# Patient Record
Sex: Female | Born: 1965 | Race: Black or African American | Hispanic: No | Marital: Single | Smoking: Never smoker
Health system: Southern US, Community
[De-identification: ages and names within clinical notes are randomized; demographics above are authoritative.]

## PROBLEM LIST (undated history)

## (undated) DIAGNOSIS — D649 Anemia, unspecified: Secondary | ICD-10-CM

## (undated) DIAGNOSIS — E079 Disorder of thyroid, unspecified: Secondary | ICD-10-CM

## (undated) HISTORY — DX: Disorder of thyroid, unspecified: E07.9

## (undated) HISTORY — DX: Anemia, unspecified: D64.9

## (undated) HISTORY — PX: BRAIN TUMOR EXCISION: SHX577

---

## 2004-09-19 ENCOUNTER — Ambulatory Visit: Payer: Self-pay

## 2005-12-17 ENCOUNTER — Ambulatory Visit: Payer: Self-pay | Admitting: Family Medicine

## 2006-05-03 DIAGNOSIS — I059 Rheumatic mitral valve disease, unspecified: Secondary | ICD-10-CM | POA: Insufficient documentation

## 2006-05-03 HISTORY — DX: Rheumatic mitral valve disease, unspecified: I05.9

## 2007-04-02 DIAGNOSIS — Z86018 Personal history of other benign neoplasm: Secondary | ICD-10-CM | POA: Insufficient documentation

## 2008-05-03 ENCOUNTER — Ambulatory Visit: Payer: Self-pay | Admitting: Family Medicine

## 2009-05-09 ENCOUNTER — Ambulatory Visit: Payer: Self-pay

## 2010-05-16 ENCOUNTER — Ambulatory Visit: Payer: Self-pay

## 2010-07-06 DIAGNOSIS — E559 Vitamin D deficiency, unspecified: Secondary | ICD-10-CM | POA: Insufficient documentation

## 2011-06-09 ENCOUNTER — Ambulatory Visit: Payer: Self-pay | Admitting: Internal Medicine

## 2011-06-10 ENCOUNTER — Ambulatory Visit: Payer: Self-pay | Admitting: Family Medicine

## 2011-06-25 ENCOUNTER — Ambulatory Visit: Payer: Self-pay | Admitting: Family Medicine

## 2011-07-05 ENCOUNTER — Ambulatory Visit: Payer: Self-pay | Admitting: Internal Medicine

## 2011-07-10 ENCOUNTER — Ambulatory Visit: Payer: Self-pay | Admitting: Internal Medicine

## 2011-08-16 ENCOUNTER — Ambulatory Visit: Payer: Self-pay | Admitting: Internal Medicine

## 2011-09-09 ENCOUNTER — Ambulatory Visit: Payer: Self-pay | Admitting: Internal Medicine

## 2012-07-15 ENCOUNTER — Ambulatory Visit: Payer: Self-pay | Admitting: Family Medicine

## 2013-08-10 ENCOUNTER — Ambulatory Visit: Payer: Self-pay | Admitting: Family Medicine

## 2013-09-08 ENCOUNTER — Ambulatory Visit: Payer: Self-pay | Admitting: Family Medicine

## 2015-07-17 DIAGNOSIS — N920 Excessive and frequent menstruation with regular cycle: Secondary | ICD-10-CM | POA: Insufficient documentation

## 2015-07-17 DIAGNOSIS — D509 Iron deficiency anemia, unspecified: Secondary | ICD-10-CM | POA: Insufficient documentation

## 2015-07-19 ENCOUNTER — Encounter: Payer: Self-pay | Admitting: Physician Assistant

## 2015-08-02 ENCOUNTER — Encounter: Payer: Self-pay | Admitting: Physician Assistant

## 2015-08-02 ENCOUNTER — Ambulatory Visit (INDEPENDENT_AMBULATORY_CARE_PROVIDER_SITE_OTHER): Payer: 59 | Admitting: Physician Assistant

## 2015-08-02 VITALS — BP 104/78 | HR 84 | Temp 98.3°F | Resp 14 | Ht 63.25 in | Wt 142.4 lb

## 2015-08-02 DIAGNOSIS — E559 Vitamin D deficiency, unspecified: Secondary | ICD-10-CM

## 2015-08-02 DIAGNOSIS — Z1239 Encounter for other screening for malignant neoplasm of breast: Secondary | ICD-10-CM | POA: Diagnosis not present

## 2015-08-02 DIAGNOSIS — E039 Hypothyroidism, unspecified: Secondary | ICD-10-CM

## 2015-08-02 DIAGNOSIS — Z0000 Encounter for general adult medical examination without abnormal findings: Secondary | ICD-10-CM | POA: Diagnosis not present

## 2015-08-02 DIAGNOSIS — D509 Iron deficiency anemia, unspecified: Secondary | ICD-10-CM

## 2015-08-02 NOTE — Patient Instructions (Signed)
Health Maintenance Adopting a healthy lifestyle and getting preventive care can go a long way to promote health and wellness. Talk with your health care provider about what schedule of regular examinations is right for you. This is a good chance for you to check in with your provider about disease prevention and staying healthy. In between checkups, there are plenty of things you can do on your own. Experts have done a lot of research about which lifestyle changes and preventive measures are most likely to keep you healthy. Ask your health care provider for more information. WEIGHT AND DIET  Eat a healthy diet 1. Be sure to include plenty of vegetables, fruits, low-fat dairy products, and lean protein. 2. Do not eat a lot of foods high in solid fats, added sugars, or salt. 3. Get regular exercise. This is one of the most important things you can do for your health. 1. Most adults should exercise for at least 150 minutes each week. The exercise should increase your heart rate and make you sweat (moderate-intensity exercise). 2. Most adults should also do strengthening exercises at least twice a week. This is in addition to the moderate-intensity exercise.  Maintain a healthy weight 1. Body mass index (BMI) is a measurement that can be used to identify possible weight problems. It estimates body fat based on height and weight. Your health care provider can help determine your BMI and help you achieve or maintain a healthy weight. 2. For females 6 years of age and older:  1. A BMI below 18.5 is considered underweight. 2. A BMI of 18.5 to 24.9 is normal. 3. A BMI of 25 to 29.9 is considered overweight. 4. A BMI of 30 and above is considered obese.  Watch levels of cholesterol and blood lipids 1. You should start having your blood tested for lipids and cholesterol at 47 years of age, then have this test every 5 years. 2. You may need to have your cholesterol levels checked more often if: 1. Your  lipid or cholesterol levels are high. 2. You are older than 47 years of age. 3. You are at high risk for heart disease.  CANCER SCREENING   Lung Cancer 1. Lung cancer screening is recommended for adults 7-87 years old who are at high risk for lung cancer because of a history of smoking. 2. A yearly low-dose CT scan of the lungs is recommended for people who: 1. Currently smoke. 2. Have quit within the past 15 years. 3. Have at least a 30-pack-year history of smoking. A pack year is smoking an average of one pack of cigarettes a day for 1 year. 3. Yearly screening should continue until it has been 15 years since you quit. 4. Yearly screening should stop if you develop a health problem that would prevent you from having lung cancer treatment.  Breast Cancer  Practice breast self-awareness. This means understanding how your breasts normally appear and feel.  It also means doing regular breast self-exams. Let your health care provider know about any changes, no matter how small.  If you are in your 20s or 30s, you should have a clinical breast exam (CBE) by a health care provider every 1-3 years as part of a regular health exam.  If you are 30 or older, have a CBE every year. Also consider having a breast X-Madlock (mammogram) every year.  If you have a family history of breast cancer, talk to your health care provider about genetic screening.  If you are  at high risk for breast cancer, talk to your health care provider about having an MRI and a mammogram every year.  Breast cancer gene (BRCA) assessment is recommended for women who have family members with BRCA-related cancers. BRCA-related cancers include:  Breast.  Ovarian.  Tubal.  Peritoneal cancers.  Results of the assessment will determine the need for genetic counseling and BRCA1 and BRCA2 testing. Cervical Cancer Routine pelvic examinations to screen for cervical cancer are no longer recommended for nonpregnant women who  are considered low risk for cancer of the pelvic organs (ovaries, uterus, and vagina) and who do not have symptoms. A pelvic examination may be necessary if you have symptoms including those associated with pelvic infections. Ask your health care provider if a screening pelvic exam is right for you.   The Pap test is the screening test for cervical cancer for women who are considered at risk.  If you had a hysterectomy for a problem that was not cancer or a condition that could lead to cancer, then you no longer need Pap tests.  If you are older than 65 years, and you have had normal Pap tests for the past 10 years, you no longer need to have Pap tests.  If you have had past treatment for cervical cancer or a condition that could lead to cancer, you need Pap tests and screening for cancer for at least 20 years after your treatment.  If you no longer get a Pap test, assess your risk factors if they change (such as having a new sexual partner). This can affect whether you should start being screened again.  Some women have medical problems that increase their chance of getting cervical cancer. If this is the case for you, your health care provider may recommend more frequent screening and Pap tests.  The human papillomavirus (HPV) test is another test that may be used for cervical cancer screening. The HPV test looks for the virus that can cause cell changes in the cervix. The cells collected during the Pap test can be tested for HPV.  The HPV test can be used to screen women 2 years of age and older. Getting tested for HPV can extend the interval between normal Pap tests from three to five years.  An HPV test also should be used to screen women of any age who have unclear Pap test results.  After 47 years of age, women should have HPV testing as often as Pap tests.  Colorectal Cancer  This type of cancer can be detected and often prevented.  Routine colorectal cancer screening usually  begins at 47 years of age and continues through 47 years of age.  Your health care provider may recommend screening at an earlier age if you have risk factors for colon cancer.  Your health care provider may also recommend using home test kits to check for hidden blood in the stool.  A small camera at the end of a tube can be used to examine your colon directly (sigmoidoscopy or colonoscopy). This is done to check for the earliest forms of colorectal cancer.  Routine screening usually begins at age 57.  Direct examination of the colon should be repeated every 5-10 years through 47 years of age. However, you may need to be screened more often if early forms of precancerous polyps or small growths are found. Skin Cancer  Check your skin from head to toe regularly.  Tell your health care provider about any new moles or changes in  moles, especially if there is a change in a mole's shape or color.  Also tell your health care provider if you have a mole that is larger than the size of a pencil eraser.  Always use sunscreen. Apply sunscreen liberally and repeatedly throughout the day.  Protect yourself by wearing long sleeves, pants, a wide-brimmed hat, and sunglasses whenever you are outside. HEART DISEASE, DIABETES, AND HIGH BLOOD PRESSURE   Have your blood pressure checked at least every 1-2 years. High blood pressure causes heart disease and increases the risk of stroke.  If you are between 32 years and 30 years old, ask your health care provider if you should take aspirin to prevent strokes.  Have regular diabetes screenings. This involves taking a blood sample to check your fasting blood sugar level.  If you are at a normal weight and have a low risk for diabetes, have this test once every three years after 47 years of age.  If you are overweight and have a high risk for diabetes, consider being tested at a younger age or more often. PREVENTING INFECTION  Hepatitis B  If you have a  higher risk for hepatitis B, you should be screened for this virus. You are considered at high risk for hepatitis B if:  You were born in a country where hepatitis B is common. Ask your health care provider which countries are considered high risk.  Your parents were born in a high-risk country, and you have not been immunized against hepatitis B (hepatitis B vaccine).  You have HIV or AIDS.  You use needles to inject street drugs.  You live with someone who has hepatitis B.  You have had sex with someone who has hepatitis B.  You get hemodialysis treatment.  You take certain medicines for conditions, including cancer, organ transplantation, and autoimmune conditions. Hepatitis C  Blood testing is recommended for:  Everyone born from [birth year] through 1963.  Anyone with known risk factors for hepatitis C. Sexually transmitted infections (STIs)  You should be screened for sexually transmitted infections (STIs) including gonorrhea and chlamydia if:  You are sexually active and are younger than 47 years of age.  You are older than 47 years of age and your health care provider tells you that you are at risk for this type of infection.  Your sexual activity has changed since you were last screened and you are at an increased risk for chlamydia or gonorrhea. Ask your health care provider if you are at risk.  If you do not have HIV, but are at risk, it may be recommended that you take a prescription medicine daily to prevent HIV infection. This is called pre-exposure prophylaxis (PrEP). You are considered at risk if:  You are sexually active and do not regularly use condoms or know the HIV status of your partner(s).  You take drugs by injection.  You are sexually active with a partner who has HIV. Talk with your health care provider about whether you are at high risk of being infected with HIV. If you choose to begin PrEP, you should first be tested for HIV. You should then be tested  every 3 months for as long as you are taking PrEP.  PREGNANCY   If you are premenopausal and you may become pregnant, ask your health care provider about preconception counseling.  If you may become pregnant, take 400 to 800 micrograms (mcg) of folic acid every day.  If you want to prevent pregnancy, talk to your  health care provider about birth control (contraception). OSTEOPOROSIS AND MENOPAUSE   Osteoporosis is a disease in which the bones lose minerals and strength with aging. This can result in serious bone fractures. Your risk for osteoporosis can be identified using a bone density scan.  If you are 34 years of age or older, or if you are at risk for osteoporosis and fractures, ask your health care provider if you should be screened.  Ask your health care provider whether you should take a calcium or vitamin D supplement to lower your risk for osteoporosis.  Menopause may have certain physical symptoms and risks.  Hormone replacement therapy may reduce some of these symptoms and risks. Talk to your health care provider about whether hormone replacement therapy is right for you.  HOME CARE INSTRUCTIONS   Schedule regular health, dental, and eye exams.  Stay current with your immunizations.   Do not use any tobacco products including cigarettes, chewing tobacco, or electronic cigarettes.  If you are pregnant, do not drink alcohol.  If you are breastfeeding, limit how much and how often you drink alcohol.  Limit alcohol intake to no more than 1 drink per day for nonpregnant women. One drink equals 12 ounces of beer, 5 ounces of wine, or 1 ounces of hard liquor.  Do not use street drugs.  Do not share needles.  Ask your health care provider for help if you need support or information about quitting drugs.  Tell your health care provider if you often feel depressed.  Tell your health care provider if you have ever been abused or do not feel safe at home. Document  Released: 06/10/2011 Document Revised: 04/11/2014 Document Reviewed: 10/27/2013 [facility] Patient Information 2015 [place], [place]. This information is not intended to replace advice given to you by your health care provider. Make sure you discuss any questions you have with your health care provider.     Why follow it? Research shows. . Those who follow the Mediterranean diet have a reduced risk of heart disease  . The diet is associated with a reduced incidence of Parkinson's and Alzheimer's diseases . People following the diet may have longer life expectancies and lower rates of chronic diseases  . The Dietary Guidelines for Americans recommends the Mediterranean diet as an eating plan to promote health and prevent disease  What Is the Mediterranean Diet?  . Healthy eating plan based on typical foods and recipes of Mediterranean-style cooking . The diet is primarily a plant based diet; these foods should make up a majority of meals   Starches - Plant based foods should make up a majority of meals - They are an important sources of vitamins, minerals, energy, antioxidants, and fiber - Choose whole grains, foods high in fiber and minimally processed items  - Typical grain sources include wheat, oats, barley, corn, brown rice, bulgar, farro, millet, polenta, couscous  - Various types of beans include chickpeas, lentils, fava beans, black beans, white beans   Fruits  Veggies - Large quantities of antioxidant rich fruits & veggies; 6 or more servings  - Vegetables can be eaten raw or lightly drizzled with oil and cooked  - Vegetables common to the traditional Mediterranean Diet include: artichokes, arugula, beets, broccoli, brussel sprouts, cabbage, carrots, celery, collard greens, cucumbers, eggplant, kale, leeks, lemons, lettuce, mushrooms, okra, onions, peas, peppers, potatoes, pumpkin, radishes, rutabaga, shallots, spinach, sweet potatoes, turnips, zucchini - Fruits common to the Mediterranean  Diet include: apples, apricots, avocados, cherries, clementines, dates, figs, grapefruits,  grapes, melons, nectarines, oranges, peaches, pears, pomegranates, strawberries, tangerines  Fats - Replace butter and margarine with healthy oils, such as olive oil, canola oil, and tahini  - Limit nuts to no more than a handful a day  - Nuts include walnuts, almonds, pecans, pistachios, pine nuts  - Limit or avoid candied, honey roasted or heavily salted nuts - Olives are central to the Mediterranean diet - can be eaten whole or used in a variety of dishes   Meats Protein - Limiting red meat: no more than a few times a month - When eating red meat: choose lean cuts and keep the portion to the size of deck of cards - Eggs: approx. 0 to 4 times a week  - Fish and lean poultry: at least 2 a week  - Healthy protein sources include, chicken, [place], lean beef, lamb - Increase intake of seafood such as tuna, salmon, trout, mackerel, shrimp, scallops - Avoid or limit high fat processed meats such as sausage and bacon  Dairy - Include moderate amounts of low fat dairy products  - Focus on healthy dairy such as fat free yogurt, skim milk, low or reduced fat cheese - Limit dairy products higher in fat such as whole or 2% milk, cheese, ice cream  Alcohol - Moderate amounts of red wine is ok  - No more than 5 oz daily for women (all ages) and men older than age 74  - No more than 10 oz of wine daily for men younger than 44  Other - Limit sweets and other desserts  - Use herbs and spices instead of salt to flavor foods  - Herbs and spices common to the traditional Mediterranean Diet include: basil, bay leaves, chives, cloves, cumin, fennel, garlic, lavender, marjoram, mint, oregano, parsley, pepper, rosemary, sage, savory, sumac, tarragon, thyme   It's not just a diet, it's a lifestyle:  . The Mediterranean diet includes lifestyle factors typical of those in the region  . Foods, drinks and meals are best eaten  with others and savored . Daily physical activity is important for overall good health . This could be strenuous exercise like running and aerobics . This could also be more leisurely activities such as walking, housework, yard-work, or taking the stairs . Moderation is the key; a balanced and healthy diet accommodates most foods and drinks . Consider portion sizes and frequency of consumption of certain foods   Meal Ideas & Options:  . Breakfast:  o Whole wheat toast or whole wheat English muffins with peanut butter & hard boiled egg o Steel cut oats topped with apples & cinnamon and skim milk  o Fresh fruit: banana, strawberries, melon, berries, peaches  o Smoothies: strawberries, bananas, greek yogurt, peanut butter o Low fat greek yogurt with blueberries and granola  o Egg white omelet with spinach and mushrooms o Breakfast couscous: whole wheat couscous, apricots, skim milk, cranberries  . Sandwiches:  o Hummus and grilled vegetables (peppers, zucchini, squash) on whole wheat bread   o Grilled chicken on whole wheat pita with lettuce, tomatoes, cucumbers or tzatziki  o Tuna salad on whole wheat bread: tuna salad made with greek yogurt, olives, red peppers, capers, green onions o Garlic rosemary lamb pita: lamb sauted with garlic, rosemary, salt & pepper; add lettuce, cucumber, greek yogurt to pita - flavor with lemon juice and black pepper  . Seafood:  o Mediterranean grilled salmon, seasoned with garlic, basil, parsley, lemon juice and black pepper o Shrimp, lemon, and spinach  whole-grain pasta salad made with low fat greek yogurt  o Seared scallops with lemon orzo  o Seared tuna steaks seasoned salt, pepper, coriander topped with tomato mixture of olives, tomatoes, olive oil, minced garlic, parsley, green onions and cappers  . Meats:  o Herbed greek chicken salad with kalamata olives, cucumber, feta  o Red bell peppers stuffed with spinach, bulgur, lean ground beef (or lentils) &  topped with feta   o Kebabs: skewers of chicken, tomatoes, onions, zucchini, squash  o [place] burgers: made with red onions, mint, dill, lemon juice, feta cheese topped with roasted red peppers . Vegetarian o Cucumber salad: cucumbers, artichoke hearts, celery, red onion, feta cheese, tossed in olive oil & lemon juice  o Hummus and whole grain pita points with a greek salad (lettuce, tomato, feta, olives, cucumbers, red onion) o Lentil soup with celery, carrots made with vegetable broth, garlic, salt and pepper  o Tabouli salad: parsley, bulgur, mint, scallions, cucumbers, tomato, radishes, lemon juice, olive oil, salt and pepper.      American Heart Association (AHA) Exercise Recommendation  Being physically active is important to prevent heart disease and stroke, the nation's No. 1and No. 5killers. To improve overall cardiovascular health, we suggest at least 150 minutes per week of moderate exercise or 75 minutes per week of vigorous exercise (or a combination of moderate and vigorous activity). Thirty minutes a day, five times a week is an easy goal to remember. You will also experience benefits even if you divide your time into two or three segments of 10 to 15 minutes per day.  For people who would benefit from lowering their blood pressure or cholesterol, we recommend 40 minutes of aerobic exercise of moderate to vigorous intensity three to four times a week to lower the risk for heart attack and stroke.  Physical activity is anything that makes you move your body and burn calories.  This includes things like climbing stairs or playing sports. Aerobic exercises benefit your heart, and include walking, jogging, swimming or biking. Strength and stretching exercises are best for overall stamina and flexibility.  The simplest, positive change you can make to effectively improve your heart health is to start walking. It's enjoyable, free, easy, social and great exercise. A walking program is  flexible and boasts high success rates because people can stick with it. It's easy for walking to become a regular and satisfying part of life.   For Overall Cardiovascular Health:  At least 30 minutes of moderate-intensity aerobic activity at least 5 days per week for a total of 150  OR   At least 25 minutes of vigorous aerobic activity at least 3 days per week for a total of 75 minutes; or a combination of moderate- and vigorous-intensity aerobic activity  AND   Moderate- to high-intensity muscle-strengthening activity at least 2 days per week for additional health benefits.  For Lowering Blood Pressure and Cholesterol  An average 40 minutes of moderate- to vigorous-intensity aerobic activity 3 or 4 times per week  What if I can't make it to the time goal? Something is always better than nothing! And everyone has to start somewhere. Even if you've been sedentary for years, today is the day you can begin to make healthy changes in your life. If you don't think you'll make it for 30 or 40 minutes, set a reachable goal for today. You can work up toward your overall goal by increasing your time as you get stronger. Don't let all-or-nothing thinking  rob you of doing what you can every day.  Source:http://www.heart.org

## 2015-08-02 NOTE — Progress Notes (Signed)
Patient ID: NIVA MURREN, female   DOB: 1966/05/16, 49 y.o.   MRN: [id number]        Patient: Karen Randolph, Female    DOB: 03/23/1966, 49 y.o.   MRN: [id number] Visit Date: 08/02/2015  Today's Provider: Mar Daring, PA-C   Chief Complaint  Patient presents with  . Annual Exam    Patient does not want a pap today   Subjective:    Annual physical exam ELEA HOLTZCLAW is a 49 y.o. female who presents today for health maintenance and complete physical. She feels well. She reports exercising twice weekly (hiking). She reports she is sleeping well.  She does report some weight gain recently but equates that to having "taken a break" from exercise over the summer. She has since started back exercise.  She is due for her pap smear but states she does not wish to have that today.  Last pap was in 2011 and reported normal.  She has no family history of cervical or ovarian cancer.  She does perform monthly breast exams and gets her annual mammogram.  There is no family history of breast cancer.  No family history of colon cancer.  -----------------------------------------------------------------   Review of Systems  Constitutional: Negative.   HENT: Negative.   Eyes: Negative.   Respiratory: Negative.   Cardiovascular: Negative.   Gastrointestinal: Negative.   Endocrine: Negative.   Genitourinary: Negative.   Skin: Negative.   Allergic/Immunologic: Negative.   Neurological: Negative.   Hematological: Negative.   Psychiatric/Behavioral: Negative.     Social History She  reports that she has never smoked. She does not have any smokeless tobacco history on file. She reports that she does not drink alcohol or use illicit drugs. Social History   Social History  . Marital Status: Single    Spouse Name: N/A  . Number of Children: N/A  . Years of Education: N/A   Social History Main Topics  . Smoking status: Never Smoker   . Smokeless tobacco: None  . Alcohol Use:  No  . Drug Use: No  . Sexual Activity: Not Asked   Other Topics Concern  . None   Social History Narrative    Patient Active Problem List   Diagnosis Date Noted  . Anemia, iron deficiency 07/17/2015  . Excess, menstruation 07/17/2015  . Avitaminosis D 07/06/2010  . 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 Brain tumor excision  2007,2010    Family History  Family Status  Relation Status Death Age  . Mother Deceased 67  . Father Deceased 88  . Maternal Grandmother Deceased   . Maternal Grandfather Deceased   . Paternal Grandmother Deceased   . Paternal Grandfather Deceased    Her family history includes Alcohol abuse in her mother; CAD in her father; CVA in her father; Healthy in her brother; Heart attack in her father; Hypertension in her father.    Allergies  Allergen Reactions  . Shellfish Allergy     Previous Medications   FERROUS SULFATE 325 (65 FE) MG TABLET    Take 1 tablet by mouth daily.   GRAPE SEED 60 MG CAPS    1 capsule daily.   OMEGA-3 FATTY ACIDS PO    1 tablet daily.   PEDIATRIC MULTIVITAMINS-IRON (MULTIPLE VITAMINS-IRON PO)    1 tablet daily.   VITAMIN D, CHOLECALCIFEROL, 400 UNITS TABS    Take  1 tablet by mouth daily.    Patient Care Team: Margarita Rana, MD as PCP - General (Family Medicine)     Objective:   Vitals: BP 104/78 mmHg  Pulse 84  Temp(Src) 98.3 F (36.8 C) (Oral)  Resp 14  Ht 5' 3.25" (1.607 m)  Wt 142 lb 6.4 oz (64.592 kg)  BMI 25.01 kg/m2  SpO2 98%  LMP 07/19/2015   Physical Exam  Constitutional: She is oriented to person, place, and time. She appears well-developed and well-nourished. No distress.  HENT:  Head: Normocephalic and atraumatic.  Right Ear: External ear normal.  Left Ear: External ear normal.  Nose: Nose normal.  Mouth/Throat: Oropharynx is clear and moist. No oropharyngeal exudate.  Eyes: Conjunctivae and  EOM are normal. Pupils are equal, round, and reactive to light. Right eye exhibits no discharge. Left eye exhibits no discharge. No scleral icterus.  Neck: Normal range of motion. Neck supple. No JVD present. No tracheal deviation present. No thyromegaly present.  Cardiovascular: Normal rate, regular rhythm, normal heart sounds and intact distal pulses.  Exam reveals no gallop and no friction rub.   No murmur heard. Pulmonary/Chest: Effort normal and breath sounds normal. No respiratory distress. She has no wheezes. She has no rales. She exhibits no tenderness. Right breast exhibits no inverted nipple, no mass, no nipple discharge, no skin change and no tenderness. Left breast exhibits no inverted nipple, no mass, no nipple discharge, no skin change and no tenderness. Breasts are symmetrical.  Abdominal: Soft. Bowel sounds are normal. She exhibits no distension and no mass. There is no tenderness. There is no rebound and no guarding.  Genitourinary:  Patient declined.  Musculoskeletal: Normal range of motion. She exhibits no edema or tenderness.  Lymphadenopathy:    She has no cervical adenopathy.  Neurological: She is alert and oriented to person, place, and time.  Skin: Skin is warm and dry. No rash noted. She is not diaphoretic.  Psychiatric: She has a normal mood and affect. Her behavior is normal. Judgment and thought content normal.  Vitals reviewed.    Depression Screen PHQ 2/9 Scores 08/02/2015  PHQ - 2 Score 0      Assessment & Plan:     Routine Health Maintenance and Physical Exam  Exercise Activities and Dietary recommendations Goals    None      Immunization History  Administered Date(s) Administered  . Tdap 04/07/2008    Health Maintenance  Topic Date Due  . HIV Screening  01/19/1983  . INFLUENZA VACCINE  07/10/2015  . TETANUS/TDAP  04/07/2018      Discussed health benefits of physical activity, and encouraged her to engage in regular exercise appropriate  for her age and condition.   1. Routine general medical examination at a health care facility Had most of her labs done for [facility] biometric health screen.  Will check other labs not done.  F/U pending labs. - TSH - Comprehensive metabolic panel - CBC with Differential  2. Breast cancer screening Normal mammogram.  Normal breast exam.  Will get annual screening mammogram. - Mammogram Digital Screening; Future  3. Avitaminosis D H/O this and on supplement.  Will recheck levels. - Vitamin D (25 hydroxy)  4. Iron deficiency anemia H/O iron def anemia.  Will recheck CBC and iron levels. - Iron  --------------------------------------------------------------------

## 2015-08-03 ENCOUNTER — Telehealth: Payer: Self-pay | Admitting: Family Medicine

## 2015-08-03 DIAGNOSIS — E039 Hypothyroidism, unspecified: Secondary | ICD-10-CM | POA: Insufficient documentation

## 2015-08-03 LAB — CBC WITH DIFFERENTIAL/PLATELET
BASOS ABS: 0 10*3/uL (ref 0.0–0.2)
Basos: 0 %
EOS (ABSOLUTE): 0.1 10*3/uL (ref 0.0–0.4)
Eos: 1 %
HEMOGLOBIN: 10.9 g/dL — AB (ref 11.1–15.9)
Hematocrit: 33.1 % — ABNORMAL LOW (ref 34.0–46.6)
IMMATURE GRANS (ABS): 0 10*3/uL (ref 0.0–0.1)
IMMATURE GRANULOCYTES: 0 %
LYMPHS: 36 %
Lymphocytes Absolute: 1.7 10*3/uL (ref 0.7–3.1)
MCH: 30.9 pg (ref 26.6–33.0)
MCHC: 32.9 g/dL (ref 31.5–35.7)
MCV: 94 fL (ref 79–97)
MONOCYTES: 8 %
Monocytes Absolute: 0.4 10*3/uL (ref 0.1–0.9)
NEUTROS PCT: 55 %
Neutrophils Absolute: 2.6 10*3/uL (ref 1.4–7.0)
PLATELETS: 359 10*3/uL (ref 150–379)
RBC: 3.53 x10E6/uL — ABNORMAL LOW (ref 3.77–5.28)
RDW: 13 % (ref 12.3–15.4)
WBC: 4.7 10*3/uL (ref 3.4–10.8)

## 2015-08-03 LAB — COMPREHENSIVE METABOLIC PANEL
ALBUMIN: 4.2 g/dL (ref 3.5–5.5)
ALT: 13 IU/L (ref 0–32)
AST: 16 IU/L (ref 0–40)
Albumin/Globulin Ratio: 1.5 (ref 1.1–2.5)
Alkaline Phosphatase: 62 IU/L (ref 39–117)
BUN/Creatinine Ratio: 11 (ref 9–23)
BUN: 10 mg/dL (ref 6–24)
CALCIUM: 9.5 mg/dL (ref 8.7–10.2)
CHLORIDE: 105 mmol/L (ref 97–108)
CO2: 25 mmol/L (ref 18–29)
CREATININE: 0.88 mg/dL (ref 0.57–1.00)
GFR, EST AFRICAN AMERICAN: 90 mL/min/{1.73_m2} (ref >59)
GFR, EST NON AFRICAN AMERICAN: 78 mL/min/{1.73_m2} (ref >59)
GLUCOSE: 92 mg/dL (ref 65–99)
Globulin, Total: 2.8 g/dL (ref 1.5–4.5)
Potassium: 4.7 mmol/L (ref 3.5–5.2)
Sodium: 144 mmol/L (ref 134–144)
TOTAL PROTEIN: 7 g/dL (ref 6.0–8.5)

## 2015-08-03 LAB — IRON: IRON: 44 ug/dL (ref 27–159)

## 2015-08-03 LAB — TSH: TSH: 21.78 u[IU]/mL — ABNORMAL HIGH (ref 0.450–4.500)

## 2015-08-03 LAB — VITAMIN D 25 HYDROXY (VIT D DEFICIENCY, FRACTURES): Vit D, 25-Hydroxy: 37.7 ng/mL (ref 30.0–100.0)

## 2015-08-03 MED ORDER — LEVOTHYROXINE SODIUM 50 MCG PO TABS: 50.0000 ug | ORAL_TABLET | Freq: Every day | ORAL | Status: DC

## 2015-08-03 NOTE — Telephone Encounter (Signed)
Patient advised as directed below. Patient verbalized understanding and agrees with treatment plan. Patient will call back to schedule a follow up appointment.

## 2015-08-03 NOTE — Addendum Note (Signed)
Addended by: Mar Daring on: 08/03/2015 02:22 PM   Modules accepted: Orders

## 2015-08-03 NOTE — Telephone Encounter (Signed)
-----   Message from Mar Daring, [place] sent at 08/03/2015  2:24 PM EDT ----- HgB is still low showing slight anemia.  Iron was stable and WNL at 44. Blood sugar, liver function and kidney function are all WNL.  TSH is elevated indicating hypothyroidism.  Will start levothyroxine 74mcg (Rx sent to [place]).  This may be cause of weight gain.  Vit D is WNL.  Will recheck TSH in 6-8 weeks to see if stabilizing on treatment.

## 2015-08-03 NOTE — Telephone Encounter (Signed)
Pt states she is returning call to [place]. GY#[phone]/[id number]

## 2015-10-02 ENCOUNTER — Telehealth: Payer: Self-pay | Admitting: Physician Assistant

## 2015-10-02 DIAGNOSIS — E039 Hypothyroidism, unspecified: Secondary | ICD-10-CM

## 2015-10-02 MED ORDER — LEVOTHYROXINE SODIUM 50 MCG PO TABS: 50.0000 ug | ORAL_TABLET | Freq: Every day | ORAL | Status: DC

## 2015-10-02 NOTE — Telephone Encounter (Signed)
Pt contacted office for refill request on the following medications: levothyroxine (SYNTHROID, LEVOTHROID) 50 MCG tablet to [facility].  Pt would also like to get a lab slip. Thanks TNP

## 2015-10-02 NOTE — Telephone Encounter (Signed)
Lab slip printed and placed up front.  Rx sent to [facility].

## 2015-10-02 NOTE — Telephone Encounter (Signed)
Please review-aa 

## 2015-10-02 NOTE — Telephone Encounter (Signed)
Pt advised-aa 

## 2015-10-23 ENCOUNTER — Other Ambulatory Visit: Payer: Self-pay | Admitting: Physician Assistant

## 2015-10-23 DIAGNOSIS — E039 Hypothyroidism, unspecified: Secondary | ICD-10-CM

## 2015-10-23 MED ORDER — LEVOTHYROXINE SODIUM 50 MCG PO TABS: 50.0000 ug | ORAL_TABLET | Freq: Every day | ORAL | Status: DC

## 2015-10-24 LAB — TSH: TSH: 4.02 u[IU]/mL (ref 0.450–4.500)

## 2015-10-25 ENCOUNTER — Telehealth: Payer: Self-pay

## 2015-10-25 NOTE — Telephone Encounter (Signed)
Patient advised as directed below.  Thanks,  -Pleas Carneal 

## 2015-10-25 NOTE — Telephone Encounter (Signed)
Opened in error  Karen Randolph

## 2015-10-25 NOTE — Telephone Encounter (Signed)
-----   Message from Mar Daring, PA-C sent at 10/24/2015  9:04 AM EST ----- TSH is now WNL.  Continue levothyroxine as prescribed.  Will recheck in 6 months.

## 2016-08-02 ENCOUNTER — Encounter: Payer: Self-pay | Admitting: Physician Assistant

## 2016-08-02 ENCOUNTER — Ambulatory Visit (INDEPENDENT_AMBULATORY_CARE_PROVIDER_SITE_OTHER): Payer: 59 | Admitting: Physician Assistant

## 2016-08-02 VITALS — BP 104/70 | HR 63 | Temp 98.5°F | Resp 14 | Ht 63.5 in | Wt 140.4 lb

## 2016-08-02 DIAGNOSIS — Z136 Encounter for screening for cardiovascular disorders: Secondary | ICD-10-CM

## 2016-08-02 DIAGNOSIS — E039 Hypothyroidism, unspecified: Secondary | ICD-10-CM

## 2016-08-02 DIAGNOSIS — Z0000 Encounter for general adult medical examination without abnormal findings: Secondary | ICD-10-CM

## 2016-08-02 DIAGNOSIS — Z1239 Encounter for other screening for malignant neoplasm of breast: Secondary | ICD-10-CM | POA: Diagnosis not present

## 2016-08-02 DIAGNOSIS — R5383 Other fatigue: Secondary | ICD-10-CM | POA: Diagnosis not present

## 2016-08-02 DIAGNOSIS — Z13220 Encounter for screening for lipoid disorders: Secondary | ICD-10-CM | POA: Diagnosis not present

## 2016-08-02 DIAGNOSIS — D509 Iron deficiency anemia, unspecified: Secondary | ICD-10-CM | POA: Diagnosis not present

## 2016-08-02 DIAGNOSIS — Z833 Family history of diabetes mellitus: Secondary | ICD-10-CM | POA: Diagnosis not present

## 2016-08-02 NOTE — Progress Notes (Signed)
Patient: Karen Randolph, Female    DOB: February 13, 1966, 50 y.o.   MRN: [id number] Visit Date: 08/02/2016  Today's Provider: Mar Daring, PA-C   No chief complaint on file.  Subjective:    Annual physical exam Karen Randolph is a 50 y.o. female who presents today for health maintenance and complete physical. She feels fairly well. She reports exercising 3 times per week. She reports she is sleeping well (average 6-8 hours per night).  -----------------------------------------------------------------  Mammo: 09/08/2013- ACR breast density Cat.B scattered fibroglandular pattern  Review of Systems  Constitutional: Positive for fatigue.  HENT: Negative.   Eyes: Negative.   Respiratory: Negative.   Cardiovascular: Negative.   Gastrointestinal: Negative.   Endocrine: Negative.   Genitourinary: Negative.   Musculoskeletal: Negative.   Skin: Negative.   Allergic/Immunologic: Negative.   Neurological: Negative.   Hematological: Negative.   Psychiatric/Behavioral: Negative.    Social History      She  reports that she has never smoked. She does not have any smokeless tobacco history on file. She reports that she does not drink alcohol or use drugs.       Social History   Social History  . Marital status: Single    Spouse name: N/A  . Number of children: N/A  . Years of education: N/A   Social History Main Topics  . Smoking status: Never Smoker  . Smokeless tobacco: Not on file  . Alcohol use No  . Drug use: No  . Sexual activity: Not on file   Other Topics Concern  . Not on file   Social History Narrative  . No narrative on file    No past medical history on file.   Patient Active Problem List   Diagnosis Date Noted  . Hypothyroidism 08/03/2015  . Anemia, iron deficiency 07/17/2015  . Excess, menstruation 07/17/2015  . Avitaminosis D 07/06/2010  . 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 BRAIN TUMOR EXCISION  2007,2010    Family History        Family Status  Relation Status  . Mother Deceased at age 31  . Father Deceased at age 43  . Maternal Grandmother Deceased  . Maternal Grandfather Deceased  . Paternal Grandmother Deceased  . Paternal Grandfather Deceased        Her family history includes Alcohol abuse in her mother; CAD in her father; CVA in her father; Healthy in her brother; Heart attack in her father; Hypertension in her father.    Allergies  Allergen Reactions  . Shellfish Allergy     No outpatient prescriptions have been marked as taking for the 08/02/16 encounter (Appointment) with Mar Daring, PA-C.    Patient Care Team: Margarita Rana, MD as PCP - General (Family Medicine)     Objective:   Vitals: There were no vitals taken for this visit.   Physical Exam  Constitutional: She is oriented to person, place, and time. She appears well-developed and well-nourished. No distress.  HENT:  Head: Normocephalic and atraumatic.  Right Ear: Tympanic membrane, external ear and ear canal normal.  Left Ear: Tympanic membrane, external ear and ear canal normal.  Nose: Nose normal.  Mouth/Throat: Uvula is midline, oropharynx is clear and moist and mucous membranes are normal. No oropharyngeal exudate, posterior oropharyngeal edema or posterior oropharyngeal erythema.  Eyes: Conjunctivae and EOM are normal.  Pupils are equal, round, and reactive to light. Right eye exhibits no discharge. Left eye exhibits no discharge. No scleral icterus.  Neck: Normal range of motion. Neck supple. No JVD present. No tracheal deviation present. No thyromegaly present.  Cardiovascular: Normal rate, regular rhythm, normal heart sounds and intact distal pulses.  Exam reveals no gallop and no friction rub.   No murmur heard. Pulmonary/Chest: Effort normal and breath sounds normal. No respiratory distress. She has  no wheezes. She has no rales. She exhibits no tenderness.  Abdominal: Soft. Bowel sounds are normal. She exhibits no distension and no mass. There is no tenderness. There is no rebound and no guarding.  Genitourinary:  Genitourinary Comments: Patient refused  Musculoskeletal: Normal range of motion. She exhibits no edema or tenderness.  Lymphadenopathy:    She has no cervical adenopathy.  Neurological: She is alert and oriented to person, place, and time.  Skin: Skin is warm and dry. No rash noted. She is not diaphoretic.  Psychiatric: She has a normal mood and affect. Her behavior is normal. Judgment and thought content normal.  Vitals reviewed.   Depression Screen PHQ 2/9 Scores 08/02/2015  PHQ - 2 Score 0    Assessment & Plan:     Routine Health Maintenance and Physical Exam  Exercise Activities and Dietary recommendations Goals    None      Immunization History  Administered Date(s) Administered  . Influenza,inj,Quad PF,36+ Mos 10/10/2015  . Tdap 04/07/2008    Health Maintenance  Topic Date Due  . HIV Screening  01/19/1983  . PAP SMEAR  01/19/1989  . INFLUENZA VACCINE  07/09/2016  . TETANUS/TDAP  04/07/2018      Discussed health benefits of physical activity, and encouraged her to engage in regular exercise appropriate for her age and condition.   1. Annual physical exam Normal physical exam today. Will check labs as below and f/u pending lab results. If labs are stable and WNL she will not need to have these rechecked for one year at her next annual physical exam. She is to call the office in the meantime if she has any acute issue, questions or concerns. - CBC with Differential - Comprehensive metabolic panel  2. Breast cancer screening Breast exam today was normal. There is no family history of breast cancer. She does perform regular self breast exams. Mammogram was ordered as below. Information for [facility] was given to patient so she may  schedule her mammogram at her convenience. - Mammogram Digital Screening; Future  3. Hypothyroidism, unspecified hypothyroidism type Increasing fatigue over last 2 months. Will check labs and adjust levothyroxine dose if necessary. - TSH  4. Iron deficiency anemia Increasing fatigue over the last 2 months with h/o iron def anemia, currently on iron supplement. Will check labs and f/u pending results.  - CBC with Differential - Iron - Iron Binding Cap (TIBC)  5. Family history of diabetes mellitus (DM) Will check labs as below and f/u pending results. - Hemoglobin A1c  6. Encounter for lipid screening for cardiovascular disease Will check labs as below and f/u pending results. - Lipid panel  7. Other fatigue Will check labs as below and f/u pending results. - Iron - Iron Binding Cap (TIBC)  --------------------------------------------------------------------    Mar Daring, PA-C  [facility]

## 2016-08-02 NOTE — Patient Instructions (Signed)
Health Maintenance, Female Adopting a healthy lifestyle and getting preventive care can go a long way to promote health and wellness. Talk with your health care provider about what schedule of regular examinations is right for you. This is a good chance for you to check in with your provider about disease prevention and staying healthy. In between checkups, there are plenty of things you can do on your own. Experts have done a lot of research about which lifestyle changes and preventive measures are most likely to keep you healthy. Ask your health care provider for more information. WEIGHT AND DIET  Eat a healthy diet  Be sure to include plenty of vegetables, fruits, low-fat dairy products, and lean protein.  Do not eat a lot of foods high in solid fats, added sugars, or salt.  Get regular exercise. This is one of the most important things you can do for your health.  Most adults should exercise for at least 150 minutes each week. The exercise should increase your heart rate and make you sweat (moderate-intensity exercise).  Most adults should also do strengthening exercises at least twice a week. This is in addition to the moderate-intensity exercise.  Maintain a healthy weight  Body mass index (BMI) is a measurement that can be used to identify possible weight problems. It estimates body fat based on height and weight. Your health care provider can help determine your BMI and help you achieve or maintain a healthy weight.  For females 28 years of age and older:   A BMI below 18.5 is considered underweight.  A BMI of 18.5 to 24.9 is normal.  A BMI of 25 to 29.9 is considered overweight.  A BMI of 30 and above is considered obese.  Watch levels of cholesterol and blood lipids  You should start having your blood tested for lipids and cholesterol at 48 years of age, then have this test every 5 years.  You may need to have your cholesterol levels checked more often if:  Your lipid  or cholesterol levels are high.  You are older than 48 years of age.  You are at high risk for heart disease.  CANCER SCREENING   Lung Cancer  Lung cancer screening is recommended for adults 75-66 years old who are at high risk for lung cancer because of a history of smoking.  A yearly low-dose CT scan of the lungs is recommended for people who:  Currently smoke.  Have quit within the past 15 years.  Have at least a 30-pack-year history of smoking. A pack year is smoking an average of one pack of cigarettes a day for 1 year.  Yearly screening should continue until it has been 15 years since you quit.  Yearly screening should stop if you develop a health problem that would prevent you from having lung cancer treatment.  Breast Cancer  Practice breast self-awareness. This means understanding how your breasts normally appear and feel.  It also means doing regular breast self-exams. Let your health care provider know about any changes, no matter how small.  If you are in your 20s or 30s, you should have a clinical breast exam (CBE) by a health care provider every 1-3 years as part of a regular health exam.  If you are 25 or older, have a CBE every year. Also consider having a breast X-ray (mammogram) every year.  If you have a family history of breast cancer, talk to your health care provider about genetic screening.  If you  are at high risk for breast cancer, talk to your health care provider about having an MRI and a mammogram every year.  Breast cancer gene (BRCA) assessment is recommended for women who have family members with BRCA-related cancers. BRCA-related cancers include:  Breast.  Ovarian.  Tubal.  Peritoneal cancers.  Results of the assessment will determine the need for genetic counseling and BRCA1 and BRCA2 testing. Cervical Cancer Your health care provider may recommend that you be screened regularly for cancer of the pelvic organs (ovaries, uterus, and  vagina). This screening involves a pelvic examination, including checking for microscopic changes to the surface of your cervix (Pap test). You may be encouraged to have this screening done every 3 years, beginning at age 21.  For women ages 30-65, health care providers may recommend pelvic exams and Pap testing every 3 years, or they may recommend the Pap and pelvic exam, combined with testing for human papilloma virus (HPV), every 5 years. Some types of HPV increase your risk of cervical cancer. Testing for HPV may also be done on women of any age with unclear Pap test results.  Other health care providers may not recommend any screening for nonpregnant women who are considered low risk for pelvic cancer and who do not have symptoms. Ask your health care provider if a screening pelvic exam is right for you.  If you have had past treatment for cervical cancer or a condition that could lead to cancer, you need Pap tests and screening for cancer for at least 20 years after your treatment. If Pap tests have been discontinued, your risk factors (such as having a new sexual partner) need to be reassessed to determine if screening should resume. Some women have medical problems that increase the chance of getting cervical cancer. In these cases, your health care provider may recommend more frequent screening and Pap tests. Colorectal Cancer  This type of cancer can be detected and often prevented.  Routine colorectal cancer screening usually begins at 48 years of age and continues through 48 years of age.  Your health care provider may recommend screening at an earlier age if you have risk factors for colon cancer.  Your health care provider may also recommend using home test kits to check for hidden blood in the stool.  A small camera at the end of a tube can be used to examine your colon directly (sigmoidoscopy or colonoscopy). This is done to check for the earliest forms of colorectal  cancer.  Routine screening usually begins at age 50.  Direct examination of the colon should be repeated every 5-10 years through 48 years of age. However, you may need to be screened more often if early forms of precancerous polyps or small growths are found. Skin Cancer  Check your skin from head to toe regularly.  Tell your health care provider about any new moles or changes in moles, especially if there is a change in a mole's shape or color.  Also tell your health care provider if you have a mole that is larger than the size of a pencil eraser.  Always use sunscreen. Apply sunscreen liberally and repeatedly throughout the day.  Protect yourself by wearing long sleeves, pants, a wide-brimmed hat, and sunglasses whenever you are outside. HEART DISEASE, DIABETES, AND HIGH BLOOD PRESSURE   High blood pressure causes heart disease and increases the risk of stroke. High blood pressure is more likely to develop in:  People who have blood pressure in the high end   of the normal range (130-139/85-89 mm Hg).  People who are overweight or obese.  People who are African American.  If you are 38-23 years of age, have your blood pressure checked every 3-5 years. If you are 61 years of age or older, have your blood pressure checked every year. You should have your blood pressure measured twice--once when you are at a hospital or clinic, and once when you are not at a hospital or clinic. Record the average of the two measurements. To check your blood pressure when you are not at a hospital or clinic, you can use:  An automated blood pressure machine at a pharmacy.  A home blood pressure monitor.  If you are between 45 years and 39 years old, ask your health care provider if you should take aspirin to prevent strokes.  Have regular diabetes screenings. This involves taking a blood sample to check your fasting blood sugar level.  If you are at a normal weight and have a low risk for diabetes,  have this test once every three years after 48 years of age.  If you are overweight and have a high risk for diabetes, consider being tested at a younger age or more often. PREVENTING INFECTION  Hepatitis B  If you have a higher risk for hepatitis B, you should be screened for this virus. You are considered at high risk for hepatitis B if:  You were born in a country where hepatitis B is common. Ask your health care provider which countries are considered high risk.  Your parents were born in a high-risk country, and you have not been immunized against hepatitis B (hepatitis B vaccine).  You have HIV or AIDS.  You use needles to inject street drugs.  You live with someone who has hepatitis B.  You have had sex with someone who has hepatitis B.  You get hemodialysis treatment.  You take certain medicines for conditions, including cancer, organ transplantation, and autoimmune conditions. Hepatitis C  Blood testing is recommended for:  Everyone born from [birth year] through 1963.  Anyone with known risk factors for hepatitis C. Sexually transmitted infections (STIs)  You should be screened for sexually transmitted infections (STIs) including gonorrhea and chlamydia if:  You are sexually active and are younger than 48 years of age.  You are older than 48 years of age and your health care provider tells you that you are at risk for this type of infection.  Your sexual activity has changed since you were last screened and you are at an increased risk for chlamydia or gonorrhea. Ask your health care provider if you are at risk.  If you do not have HIV, but are at risk, it may be recommended that you take a prescription medicine daily to prevent HIV infection. This is called pre-exposure prophylaxis (PrEP). You are considered at risk if:  You are sexually active and do not regularly use condoms or know the HIV status of your partner(s).  You take drugs by injection.  You are sexually  active with a partner who has HIV. Talk with your health care provider about whether you are at high risk of being infected with HIV. If you choose to begin PrEP, you should first be tested for HIV. You should then be tested every 3 months for as long as you are taking PrEP.  PREGNANCY   If you are premenopausal and you may become pregnant, ask your health care provider about preconception counseling.  If you may  become pregnant, take 400 to 800 micrograms (mcg) of folic acid every day.  If you want to prevent pregnancy, talk to your health care provider about birth control (contraception). OSTEOPOROSIS AND MENOPAUSE   Osteoporosis is a disease in which the bones lose minerals and strength with aging. This can result in serious bone fractures. Your risk for osteoporosis can be identified using a bone density scan.  If you are 61 years of age or older, or if you are at risk for osteoporosis and fractures, ask your health care provider if you should be screened.  Ask your health care provider whether you should take a calcium or vitamin D supplement to lower your risk for osteoporosis.  Menopause may have certain physical symptoms and risks.  Hormone replacement therapy may reduce some of these symptoms and risks. Talk to your health care provider about whether hormone replacement therapy is right for you.  HOME CARE INSTRUCTIONS   Schedule regular health, dental, and eye exams.  Stay current with your immunizations.   Do not use any tobacco products including cigarettes, chewing tobacco, or electronic cigarettes.  If you are pregnant, do not drink alcohol.  If you are breastfeeding, limit how much and how often you drink alcohol.  Limit alcohol intake to no more than 1 drink per day for nonpregnant women. One drink equals 12 ounces of beer, 5 ounces of wine, or 1 ounces of hard liquor.  Do not use street drugs.  Do not share needles.  Ask your health care provider for help if  you need support or information about quitting drugs.  Tell your health care provider if you often feel depressed.  Tell your health care provider if you have ever been abused or do not feel safe at home.   This information is not intended to replace advice given to you by your health care provider. Make sure you discuss any questions you have with your health care provider.   Document Released: 06/10/2011 Document Revised: 12/16/2014 Document Reviewed: 10/27/2013 Elsevier Interactive Patient Education [employer].

## 2016-08-27 ENCOUNTER — Telehealth: Payer: Self-pay

## 2016-08-27 LAB — CBC WITH DIFFERENTIAL/PLATELET
BASOS ABS: 0 10*3/uL (ref 0.0–0.2)
Basos: 0 %
EOS (ABSOLUTE): 0.1 10*3/uL (ref 0.0–0.4)
EOS: 2 %
HEMATOCRIT: 32.5 % — AB (ref 34.0–46.6)
HEMOGLOBIN: 11.2 g/dL (ref 11.1–15.9)
IMMATURE GRANS (ABS): 0 10*3/uL (ref 0.0–0.1)
IMMATURE GRANULOCYTES: 0 %
LYMPHS: 39 %
Lymphocytes Absolute: 1.2 10*3/uL (ref 0.7–3.1)
MCH: 31.5 pg (ref 26.6–33.0)
MCHC: 34.5 g/dL (ref 31.5–35.7)
MCV: 92 fL (ref 79–97)
MONOCYTES: 12 %
Monocytes Absolute: 0.4 10*3/uL (ref 0.1–0.9)
Neutrophils Absolute: 1.5 10*3/uL (ref 1.4–7.0)
Neutrophils: 47 %
Platelets: 335 10*3/uL (ref 150–379)
RBC: 3.55 x10E6/uL — AB (ref 3.77–5.28)
RDW: 12.4 % (ref 12.3–15.4)
WBC: 3.1 10*3/uL — AB (ref 3.4–10.8)

## 2016-08-27 LAB — COMPREHENSIVE METABOLIC PANEL
ALBUMIN: 3.9 g/dL (ref 3.5–5.5)
ALK PHOS: 62 IU/L (ref 39–117)
ALT: 19 IU/L (ref 0–32)
AST: 24 IU/L (ref 0–40)
Albumin/Globulin Ratio: 1.5 (ref 1.2–2.2)
BUN/Creatinine Ratio: 6 — ABNORMAL LOW (ref 9–23)
BUN: 5 mg/dL — AB (ref 6–24)
Bilirubin Total: 0.4 mg/dL (ref 0.0–1.2)
CALCIUM: 9 mg/dL (ref 8.7–10.2)
CO2: 20 mmol/L (ref 18–29)
CREATININE: 0.85 mg/dL (ref 0.57–1.00)
Chloride: 108 mmol/L — ABNORMAL HIGH (ref 96–106)
GFR calc Af Amer: 94 mL/min/{1.73_m2} (ref >59)
GFR, EST NON AFRICAN AMERICAN: 81 mL/min/{1.73_m2} (ref >59)
GLUCOSE: 80 mg/dL (ref 65–99)
Globulin, Total: 2.6 g/dL (ref 1.5–4.5)
Potassium: 4.1 mmol/L (ref 3.5–5.2)
Sodium: 143 mmol/L (ref 134–144)
TOTAL PROTEIN: 6.5 g/dL (ref 6.0–8.5)

## 2016-08-27 LAB — LIPID PANEL
CHOLESTEROL TOTAL: 170 mg/dL (ref 100–199)
Chol/HDL Ratio: 1.7 ratio units (ref 0.0–4.4)
HDL: 102 mg/dL (ref >39)
LDL Calculated: 59 mg/dL (ref 0–99)
Triglycerides: 45 mg/dL (ref 0–149)
VLDL CHOLESTEROL CAL: 9 mg/dL (ref 5–40)

## 2016-08-27 LAB — IRON AND TIBC
IRON SATURATION: 20 % (ref 15–55)
IRON: 57 ug/dL (ref 27–159)
Total Iron Binding Capacity: 286 ug/dL (ref 250–450)
UIBC: 229 ug/dL (ref 131–425)

## 2016-08-27 LAB — HEMOGLOBIN A1C
ESTIMATED AVERAGE GLUCOSE: 91 mg/dL
Hgb A1c MFr Bld: 4.8 % (ref 4.8–5.6)

## 2016-08-27 LAB — TSH: TSH: 1.97 u[IU]/mL (ref 0.450–4.500)

## 2016-08-27 NOTE — Telephone Encounter (Signed)
-----   Message from Mar Daring, PA-C sent at 08/27/2016 10:03 AM EDT ----- Iron and HgB have improved. WBC count is slightly low. HgBA1c and cholesterol are perfect. TSH is still WNL. Will recheck CBC in 4 weeks to make see if WBC is stable or if it improves.

## 2016-08-27 NOTE — Telephone Encounter (Signed)
Patient advised as directed below. Per patient she will call a few days before she goes to the labs.  Thanks,  -[name]

## 2016-09-25 ENCOUNTER — Telehealth: Payer: Self-pay

## 2016-09-25 DIAGNOSIS — D508 Other iron deficiency anemias: Secondary | ICD-10-CM

## 2016-09-25 NOTE — Telephone Encounter (Signed)
CBC lab slip printed.

## 2016-09-25 NOTE — Telephone Encounter (Signed)
Patient is requesting to pick up  a lab slip.  CB#[phone]

## 2016-10-03 ENCOUNTER — Other Ambulatory Visit: Payer: Self-pay | Admitting: Physician Assistant

## 2016-10-03 DIAGNOSIS — E039 Hypothyroidism, unspecified: Secondary | ICD-10-CM

## 2016-10-04 NOTE — Telephone Encounter (Signed)
Last ov 08/02/16. TSH 1.970 checked on 08/26/16

## 2016-10-07 ENCOUNTER — Ambulatory Visit
Admission: RE | Admit: 2016-10-07 | Discharge: 2016-10-07 | Disposition: A | Discharge status: Home / Self Care | Payer: 59 | Source: Ambulatory Visit | Attending: Physician Assistant | Admitting: Physician Assistant

## 2016-10-07 DIAGNOSIS — Z1239 Encounter for other screening for malignant neoplasm of breast: Secondary | ICD-10-CM

## 2016-10-07 DIAGNOSIS — Z1231 Encounter for screening mammogram for malignant neoplasm of breast: Secondary | ICD-10-CM | POA: Diagnosis present

## 2016-10-08 ENCOUNTER — Telehealth: Payer: Self-pay

## 2016-10-08 LAB — CBC WITH DIFFERENTIAL/PLATELET
BASOS ABS: 0 10*3/uL (ref 0.0–0.2)
Basos: 1 %
EOS (ABSOLUTE): 0.1 10*3/uL (ref 0.0–0.4)
Eos: 3 %
HEMOGLOBIN: 12.3 g/dL (ref 11.1–15.9)
Hematocrit: 36.7 % (ref 34.0–46.6)
IMMATURE GRANS (ABS): 0 10*3/uL (ref 0.0–0.1)
IMMATURE GRANULOCYTES: 0 %
LYMPHS: 30 %
Lymphocytes Absolute: 1.1 10*3/uL (ref 0.7–3.1)
MCH: 31.5 pg (ref 26.6–33.0)
MCHC: 33.5 g/dL (ref 31.5–35.7)
MCV: 94 fL (ref 79–97)
MONOCYTES: 11 %
Monocytes Absolute: 0.4 10*3/uL (ref 0.1–0.9)
NEUTROS ABS: 1.9 10*3/uL (ref 1.4–7.0)
Neutrophils: 55 %
Platelets: 327 10*3/uL (ref 150–379)
RBC: 3.9 x10E6/uL (ref 3.77–5.28)
RDW: 13.4 % (ref 12.3–15.4)
WBC: 3.5 10*3/uL (ref 3.4–10.8)

## 2016-10-08 NOTE — Telephone Encounter (Signed)
-----   Message from Mar Daring, [place] sent at 10/07/2016  4:15 PM EDT ----- Normal mammogram. Repeat screening in one year.

## 2016-10-08 NOTE — Telephone Encounter (Signed)
-----   Message from Mar Daring, PA-C sent at 10/08/2016  8:22 AM EDT ----- Labs stable. WBC improved to 3.5.

## 2016-10-08 NOTE — Telephone Encounter (Signed)
Patient advised as below.  

## 2017-02-08 ENCOUNTER — Encounter: Payer: Self-pay | Admitting: Family Medicine

## 2017-02-08 ENCOUNTER — Ambulatory Visit (INDEPENDENT_AMBULATORY_CARE_PROVIDER_SITE_OTHER): Payer: 59 | Admitting: Family Medicine

## 2017-02-08 VITALS — BP 124/82 | HR 88 | Temp 98.1°F | Resp 16 | Wt 145.0 lb

## 2017-02-08 DIAGNOSIS — M545 Low back pain, unspecified: Secondary | ICD-10-CM

## 2017-02-08 NOTE — Patient Instructions (Signed)
May increase ibuprofen to 800 mg. 3 x day. May add Tylenol if needed up to 3000 mg/day. We will call you with the x-ray results.

## 2017-02-08 NOTE — Progress Notes (Signed)
Subjective:     Patient ID: Karen Randolph, female   DOB: 1966-01-07, 52 y.o.   MRN: [id number]  HPI  Chief Complaint  Patient presents with  . Back Pain    Started about a month ago.   States she slept on a sofa prior to the onset of her sx. Reports pain is usually dull but will become sharp when she changes positions or extends her leg. Denies radiation of pain or prior back injury. Usually runs and uses machine weights for exercise but has not done so since her back became sore. Works as an Passenger transport manager for [employer] but denies any ergonomic issues. Has been taking ibuprofen 400 mg.twice daily.   Review of Systems     Objective:   Physical Exam  Constitutional: She appears well-developed and well-nourished. No distress (minimal difficulty changing positions).  Musculoskeletal:  Muscle strength in lower extremities 5/5. SLR's to 90 degrees without radiation of back pain. Localizes to her right lumbar paravertebral area-non-tender to the touch.       Assessment:    1. Acute right-sided low back pain without sciatica - DG Lumbar Spine Complete; Future    Plan:    Discussed increasing dose of ibuprofen and adding tylenol prn. Further f/u pending x-ray.

## 2017-07-03 ENCOUNTER — Other Ambulatory Visit: Payer: Self-pay | Admitting: Physician Assistant

## 2017-07-03 ENCOUNTER — Other Ambulatory Visit: Payer: Self-pay | Admitting: Family Medicine

## 2017-07-03 DIAGNOSIS — E039 Hypothyroidism, unspecified: Secondary | ICD-10-CM

## 2017-07-03 MED ORDER — LEVOTHYROXINE SODIUM 50 MCG PO TABS: ORAL_TABLET | ORAL | 0 refills | Status: DC

## 2017-07-03 MED ORDER — LEVOTHYROXINE SODIUM 50 MCG PO TABS: ORAL_TABLET | ORAL | 1 refills | Status: DC

## 2017-07-03 NOTE — Telephone Encounter (Signed)
Please review. Thanks!  

## 2017-07-03 NOTE — Telephone Encounter (Signed)
Left patient a message advising her.  

## 2017-07-03 NOTE — Telephone Encounter (Signed)
Done

## 2017-07-03 NOTE — Telephone Encounter (Signed)
Pt needs refill on her levothyroxine 50 MCG.  Please send rx to [facility] but also send 30 days to  [facility].  Pt is completely out of prescription  Thanks Con Memos

## 2017-08-04 ENCOUNTER — Encounter: Payer: Self-pay | Admitting: Physician Assistant

## 2017-08-04 ENCOUNTER — Ambulatory Visit (INDEPENDENT_AMBULATORY_CARE_PROVIDER_SITE_OTHER): Payer: 59 | Admitting: Physician Assistant

## 2017-08-04 VITALS — BP 110/80 | HR 88 | Temp 97.7°F | Resp 16 | Ht 64.0 in | Wt 137.0 lb

## 2017-08-04 DIAGNOSIS — E559 Vitamin D deficiency, unspecified: Secondary | ICD-10-CM

## 2017-08-04 DIAGNOSIS — Z1231 Encounter for screening mammogram for malignant neoplasm of breast: Secondary | ICD-10-CM

## 2017-08-04 DIAGNOSIS — D508 Other iron deficiency anemias: Secondary | ICD-10-CM | POA: Diagnosis not present

## 2017-08-04 DIAGNOSIS — Z0000 Encounter for general adult medical examination without abnormal findings: Secondary | ICD-10-CM

## 2017-08-04 DIAGNOSIS — E039 Hypothyroidism, unspecified: Secondary | ICD-10-CM

## 2017-08-04 DIAGNOSIS — Z1239 Encounter for other screening for malignant neoplasm of breast: Secondary | ICD-10-CM

## 2017-08-04 DIAGNOSIS — R7309 Other abnormal glucose: Secondary | ICD-10-CM

## 2017-08-04 DIAGNOSIS — Z114 Encounter for screening for human immunodeficiency virus [HIV]: Secondary | ICD-10-CM | POA: Diagnosis not present

## 2017-08-04 DIAGNOSIS — Z23 Encounter for immunization: Secondary | ICD-10-CM

## 2017-08-04 DIAGNOSIS — Z124 Encounter for screening for malignant neoplasm of cervix: Secondary | ICD-10-CM | POA: Diagnosis not present

## 2017-08-04 NOTE — Patient Instructions (Signed)

## 2017-08-04 NOTE — Progress Notes (Signed)
Patient: Karen Randolph, Female    DOB: 10-14-1966, 51 y.o.   MRN: [id number] Visit Date: 08/04/2017  Today's Provider: Mar Daring, PA-C   Chief Complaint  Patient presents with  . Annual Exam   Subjective:    Annual physical exam Karen Randolph is a 51 y.o. female who presents today for health maintenance and complete physical. She feels well. She reports exercising. She reports she is sleeping well. She reports that she is currently on her period. Declined pap.  08/02/16 Last CPE 10/07/16 Mammogram: BI-RADS 1 -----------------------------------------------------------------   Review of Systems  Constitutional: Negative.   HENT: Negative.   Eyes: Negative.   Respiratory: Negative.   Cardiovascular: Negative.   Gastrointestinal: Negative.   Endocrine: Negative.   Genitourinary: Negative.   Musculoskeletal: Negative.   Skin: Negative.   Allergic/Immunologic: Negative.   Neurological: Negative.   Hematological: Negative.   Psychiatric/Behavioral: Negative.     Social History      She  reports that she has never smoked. She has never used smokeless tobacco. She reports that she does not drink alcohol or use drugs.       Social History   Social History  . Marital status: Single    Spouse name: N/A  . Number of children: N/A  . Years of education: N/A   Social History Main Topics  . Smoking status: Never Smoker  . Smokeless tobacco: Never Used  . Alcohol use No  . Drug use: No  . Sexual activity: No   Other Topics Concern  . None   Social History Narrative  . None    History reviewed. No pertinent past medical history.   Patient Active Problem List   Diagnosis Date Noted  . Hypothyroidism 08/03/2015  . Anemia, iron deficiency 07/17/2015  . Excess, menstruation 07/17/2015  . Avitaminosis D 07/06/2010  . 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 BRAIN TUMOR EXCISION  2007,2010    Family History        Family Status  Relation Status  . Mother Deceased at age 59  . Father Deceased at age 78  . MGM Deceased  . MGF Deceased  . PGM Deceased  . PGF Deceased  . Brother (Not Specified)        Her family history includes Alcohol abuse in her mother; CAD in her father; CVA in her father; Healthy in her brother; Heart attack in her father; Hypertension in her father.     Allergies  Allergen Reactions  . Shellfish Allergy      Current Outpatient Prescriptions:  .  ferrous sulfate 325 (65 FE) MG tablet, Take 1 tablet by mouth daily., Disp: , Rfl:  .  Grape Seed 60 MG CAPS, 1 capsule daily., Disp: , Rfl:  .  levothyroxine (SYNTHROID, LEVOTHROID) 50 MCG tablet, TAKE 1 TABLET BY MOUTH  DAILY BEFORE BREAKFAST, Disp: 30 tablet, Rfl: 0 .  OMEGA-3 FATTY ACIDS PO, 1 tablet daily., Disp: , Rfl:  .  Pediatric Multivitamins-Iron (MULTIPLE VITAMINS-IRON PO), 1 tablet daily., Disp: , Rfl:  .  Vitamin D, Cholecalciferol, 400 UNITS TABS, Take 1 tablet by mouth daily., Disp: , Rfl:    Patient Care Team: Margarita Rana, MD as PCP - General (Family Medicine)      Objective:   Vitals: BP 110/80 (BP Location: Left Arm, Patient Position: Sitting, Cuff  Size: Normal)   Pulse 88   Temp 97.7 F (36.5 C) (Oral)   Resp 16   Ht 5\' 4"  (1.626 m)   Wt 137 lb (62.1 kg)   LMP 08/02/2017   BMI 23.52 kg/m    Physical Exam  Constitutional: She is oriented to person, place, and time. She appears well-developed and well-nourished. No distress.  HENT:  Head: Normocephalic and atraumatic.  Right Ear: Hearing, tympanic membrane, external ear and ear canal normal.  Left Ear: Hearing, tympanic membrane, external ear and ear canal normal.  Nose: Nose normal.  Mouth/Throat: Uvula is midline, oropharynx is clear and moist and mucous membranes are normal. No oropharyngeal exudate.  Eyes: Pupils are equal, round, and reactive to  light. Conjunctivae and EOM are normal. Right eye exhibits no discharge. Left eye exhibits no discharge. No scleral icterus.  Neck: Normal range of motion. Neck supple. No JVD present. No tracheal deviation present. No thyromegaly present.  Cardiovascular: Normal rate, regular rhythm, normal heart sounds and intact distal pulses.  Exam reveals no gallop and no friction rub.   No murmur heard. Pulmonary/Chest: Effort normal and breath sounds normal. No respiratory distress. She has no wheezes. She has no rales. She exhibits no tenderness. Right breast exhibits no inverted nipple, no mass, no nipple discharge, no skin change and no tenderness. Left breast exhibits no inverted nipple, no mass, no nipple discharge, no skin change and no tenderness. Breasts are symmetrical.  Abdominal: Soft. Bowel sounds are normal. She exhibits no distension and no mass. There is no tenderness. There is no rebound and no guarding.  Genitourinary:  Genitourinary Comments: Patient declined pap due to menstrual cycle  Musculoskeletal: Normal range of motion. She exhibits no edema or tenderness.  Lymphadenopathy:    She has no cervical adenopathy.  Neurological: She is alert and oriented to person, place, and time. She has normal reflexes.  Skin: Skin is warm and dry. No rash noted. She is not diaphoretic.  Psychiatric: She has a normal mood and affect. Her behavior is normal. Judgment and thought content normal.  Vitals reviewed.    Depression Screen PHQ 2/9 Scores 08/04/2017 08/02/2015  PHQ - 2 Score 0 0      Assessment & Plan:     Routine Health Maintenance and Physical Exam  Exercise Activities and Dietary recommendations Goals    None      Immunization History  Administered Date(s) Administered  . Influenza,inj,Quad PF,6+ Mos 10/10/2015  . Tdap 04/07/2008    Health Maintenance  Topic Date Due  . HIV Screening  01/19/1983  . PAP SMEAR  01/19/1989  . INFLUENZA VACCINE  07/09/2017  .  TETANUS/TDAP  04/07/2018     Discussed health benefits of physical activity, and encouraged her to engage in regular exercise appropriate for her age and condition.    1. Annual physical exam Normal physical exam today. Will check labs as below and f/u pending lab results. If labs are stable and WNL she will not need to have these rechecked for one year at her next annual physical exam. She is to call the office in the meantime if she has any acute issue, questions or concerns. - CBC with Differential/Platelet - Comprehensive metabolic panel - Hemoglobin A1c - Lipid panel  2. Breast cancer screening Breast exam today was normal. There is no family history of breast cancer. She does perform regular self breast exams. Mammogram was ordered as below. Information for [facility] was given to patient so she  may schedule her mammogram at her convenience. - MM DIGITAL SCREENING BILATERAL; Future  3. Cervical cancer screening Patient declined pap. Last pap was in 2011 and normal.   4. Hypothyroidism, unspecified type Will check labs as below and f/u pending results. - TSH  5. Other iron deficiency anemia Will check labs as below and f/u pending results. - CBC with Differential/Platelet - Comprehensive metabolic panel  6. Avitaminosis D Will check labs as below and f/u pending results. - Vitamin D (25 hydroxy)  7. Elevated hemoglobin A1c Will check labs as below and f/u pending results. - Hemoglobin A1c  8. Screening for HIV without presence of risk factors - HIV antibody (with reflex)  9. Need for influenza vaccination Flu Vaccine given to patient without complications. Patient sat for 15 minutes after administration and was tolerated well without adverse effects. - Flu Vaccine QUAD 36+ mos IM  --------------------------------------------------------------------    Mar Daring, PA-C  [facility]

## 2017-10-06 ENCOUNTER — Telehealth: Payer: Self-pay | Admitting: Physician Assistant

## 2017-10-06 NOTE — Telephone Encounter (Signed)
Pt stated that she was supposed to have labs done in August but she has lost the lab slip and request to pick up another to have labs done at [employer] since she is a [employer]. Please advise. Thanks TNP

## 2017-10-06 NOTE — Telephone Encounter (Signed)
Patient advised that lab requisition is placed up front ready for pick up.  Thanks,  -Alastair Hennes

## 2017-10-21 DIAGNOSIS — D508 Other iron deficiency anemias: Secondary | ICD-10-CM | POA: Diagnosis not present

## 2017-10-21 DIAGNOSIS — R7309 Other abnormal glucose: Secondary | ICD-10-CM | POA: Diagnosis not present

## 2017-10-21 DIAGNOSIS — Z0000 Encounter for general adult medical examination without abnormal findings: Secondary | ICD-10-CM | POA: Diagnosis not present

## 2017-10-22 LAB — CBC WITH DIFFERENTIAL/PLATELET
Basophils Absolute: 0 10*3/uL (ref 0.0–0.2)
Basos: 0 %
EOS (ABSOLUTE): 0.1 10*3/uL (ref 0.0–0.4)
Eos: 3 %
Hematocrit: 34.4 % (ref 34.0–46.6)
Hemoglobin: 11.2 g/dL (ref 11.1–15.9)
Immature Grans (Abs): 0 10*3/uL (ref 0.0–0.1)
Immature Granulocytes: 0 %
Lymphocytes Absolute: 1.1 10*3/uL (ref 0.7–3.1)
Lymphs: 36 %
MCH: 30.2 pg (ref 26.6–33.0)
MCHC: 32.6 g/dL (ref 31.5–35.7)
MCV: 93 fL (ref 79–97)
Monocytes Absolute: 0.3 10*3/uL (ref 0.1–0.9)
Monocytes: 10 %
Neutrophils Absolute: 1.5 10*3/uL (ref 1.4–7.0)
Neutrophils: 51 %
Platelets: 374 10*3/uL (ref 150–379)
RBC: 3.71 x10E6/uL — ABNORMAL LOW (ref 3.77–5.28)
RDW: 14.6 % (ref 12.3–15.4)
WBC: 2.9 10*3/uL — ABNORMAL LOW (ref 3.4–10.8)

## 2017-10-22 LAB — COMPREHENSIVE METABOLIC PANEL WITH GFR
ALT: 29 [IU]/L (ref 0–32)
AST: 30 [IU]/L (ref 0–40)
Albumin/Globulin Ratio: 1.8 (ref 1.2–2.2)
Albumin: 4.3 g/dL (ref 3.5–5.5)
Alkaline Phosphatase: 67 [IU]/L (ref 39–117)
BUN/Creatinine Ratio: 7 — ABNORMAL LOW (ref 9–23)
BUN: 6 mg/dL (ref 6–24)
Bilirubin Total: 0.3 mg/dL (ref 0.0–1.2)
CO2: 22 mmol/L (ref 20–29)
Calcium: 9.5 mg/dL (ref 8.7–10.2)
Chloride: 104 mmol/L (ref 96–106)
Creatinine, Ser: 0.84 mg/dL (ref 0.57–1.00)
GFR calc Af Amer: 94 mL/min/{1.73_m2}
GFR calc non Af Amer: 82 mL/min/{1.73_m2}
Globulin, Total: 2.4 g/dL (ref 1.5–4.5)
Glucose: 80 mg/dL (ref 65–99)
Potassium: 4.2 mmol/L (ref 3.5–5.2)
Sodium: 140 mmol/L (ref 134–144)
Total Protein: 6.7 g/dL (ref 6.0–8.5)

## 2017-10-22 LAB — HEMOGLOBIN A1C
Est. average glucose Bld gHb Est-mCnc: 100 mg/dL
Hgb A1c MFr Bld: 5.1 % (ref 4.8–5.6)

## 2017-10-22 LAB — LIPID PANEL
CHOLESTEROL TOTAL: 215 mg/dL — AB (ref 100–199)
Chol/HDL Ratio: 1.9 ratio (ref 0.0–4.4)
HDL: 112 mg/dL (ref >39)
LDL CALC: 91 mg/dL (ref 0–99)
Triglycerides: 58 mg/dL (ref 0–149)
VLDL CHOLESTEROL CAL: 12 mg/dL (ref 5–40)

## 2017-10-22 LAB — TSH: TSH: 2.99 u[IU]/mL (ref 0.450–4.500)

## 2017-10-22 LAB — HIV ANTIBODY (ROUTINE TESTING W REFLEX): HIV Screen 4th Generation wRfx: NONREACTIVE

## 2017-10-22 LAB — VITAMIN D 25 HYDROXY (VIT D DEFICIENCY, FRACTURES): Vit D, 25-Hydroxy: 38.8 ng/mL (ref 30.0–100.0)

## 2017-11-14 ENCOUNTER — Telehealth: Payer: Self-pay | Admitting: Physician Assistant

## 2017-11-14 ENCOUNTER — Ambulatory Visit
Admission: RE | Admit: 2017-11-14 | Discharge: 2017-11-14 | Disposition: A | Discharge status: Home / Self Care | Payer: 59 | Source: Ambulatory Visit | Attending: Physician Assistant | Admitting: Physician Assistant

## 2017-11-14 DIAGNOSIS — Z1231 Encounter for screening mammogram for malignant neoplasm of breast: Secondary | ICD-10-CM | POA: Insufficient documentation

## 2017-11-14 DIAGNOSIS — Z1239 Encounter for other screening for malignant neoplasm of breast: Secondary | ICD-10-CM

## 2017-11-14 NOTE — Telephone Encounter (Signed)
Pt returned call for mammogram results. Please advise. Thanks TNP

## 2017-11-14 NOTE — Telephone Encounter (Signed)
-----   Message from Mar Daring, PA-C sent at 11/14/2017 10:14 AM EST ----- Normal mammogram. Repeat screening in one year.

## 2017-11-14 NOTE — Telephone Encounter (Signed)
Patient advised.

## 2018-01-05 ENCOUNTER — Other Ambulatory Visit: Payer: Self-pay | Admitting: Physician Assistant

## 2018-01-05 DIAGNOSIS — E039 Hypothyroidism, unspecified: Secondary | ICD-10-CM

## 2018-01-27 ENCOUNTER — Telehealth: Payer: Self-pay

## 2018-01-27 NOTE — Telephone Encounter (Signed)
Hello Karen Randolph, Karen Randolph is on your scheduled for March 1 to see you to update her on Immunization that she needs to travel to [place]. Spoke with patient and she reports that she is going to [place] and [place]. Did some research and it look like she is going to need Typhoid, Hep A,Diphteria,Tetanus which she has a tdap that was given 04/07/2008. Higher Risk is Cholera.  I also found the information for the clinic in [place].  [facility] Address: [address], [place] [address]  Telephone [phone]  Do you want patient to come in or just go to the travelers clinic?

## 2018-01-28 NOTE — Telephone Encounter (Signed)
We could boost her Td and start Hep A but we do not have typhoid so she would require another visit with either health dept or this travel clinic for that. There is a new vaccine for cholera but it is a live vaccine so she may not choose that and I know we dont have that either. Let her know her choices and see which she prefers.

## 2018-01-28 NOTE — Telephone Encounter (Signed)
Patient advised as directed below. Per patient she is going to call the travel clinic and will give a call back to let me know on her decision.  Thanks,  -Yocelin Vanlue

## 2018-02-06 ENCOUNTER — Ambulatory Visit: Payer: 59 | Admitting: Physician Assistant

## 2018-02-20 ENCOUNTER — Ambulatory Visit (INDEPENDENT_AMBULATORY_CARE_PROVIDER_SITE_OTHER): Payer: Managed Care, Other (non HMO) | Admitting: Internal Medicine

## 2018-02-20 DIAGNOSIS — Z7189 Other specified counseling: Secondary | ICD-10-CM

## 2018-02-20 DIAGNOSIS — Z7185 Encounter for immunization safety counseling: Secondary | ICD-10-CM

## 2018-02-20 DIAGNOSIS — Z23 Encounter for immunization: Secondary | ICD-10-CM | POA: Diagnosis not present

## 2018-02-20 DIAGNOSIS — Z789 Other specified health status: Secondary | ICD-10-CM | POA: Diagnosis not present

## 2018-02-20 DIAGNOSIS — Z9189 Other specified personal risk factors, not elsewhere classified: Secondary | ICD-10-CM | POA: Diagnosis not present

## 2018-02-20 DIAGNOSIS — Z7184 Encounter for health counseling related to travel: Secondary | ICD-10-CM

## 2018-02-20 MED ORDER — TYPHOID VACCINE PO CPDR: 1.0000 | DELAYED_RELEASE_CAPSULE | ORAL | 0 refills | Status: DC

## 2018-02-20 MED ORDER — ATOVAQUONE-PROGUANIL HCL 250-100 MG PO TABS: 1.0000 | ORAL_TABLET | Freq: Every day | ORAL | 0 refills | Status: DC

## 2018-02-20 MED ORDER — AZITHROMYCIN 500 MG PO TABS: 500.0000 mg | ORAL_TABLET | Freq: Every day | ORAL | 0 refills | Status: DC

## 2018-02-20 NOTE — Patient Instructions (Signed)
[facility]                [address], [place]                   [place], [place] [address]                      Phone: [phone]                        Fax: [phone]   Planned departure date: may 3     Planned return date: may 20 Countries of travel: [place]  Guidelines for the Prevention & Treatment of Traveler's Diarrhea  Prevention: "Boil it, Peel it, Cook it, or Forget it"   the fewer chances -> lower risk: try to stick to food & water precautions as much as possible"   If it's "piping hot"; it is probably okay, if not, it may not be   Treatment   1) You should always take care to drink lots of fluids in order to avoid dehydration   2) You should bring medications with you in case you come down with a case of diarrhea   3) OTC = bring pepto-bismol - can take with initial abdominal symptoms;                    Imodium - can help slow down your intestinal tract, can help relief cramps                    and diarrhea, can take if no bloody diarrhea  Use azithromycin if needed for traveler's diarrhea  Guidelines for the Prevention of Malaria  Avoidance:  -fewer mosquito bites = lower risk. Mosquitos can bite at night as well as daytime  -cover up (long sleeve clothing), mosquito nets, screens  -Insect repellent for your skin ( DEET containing lotion > 20%): for clothes ( permethrin spray)   On arrival to [place], start malarone for malaria prevention.   Immunizations received today: oral typhoid, and tdap Future immunizations, - none  Prior to travel:  1) Be sure to pick up appropriate prescriptions, including medicine you take daily. Do not expect to be able to fill your prescriptions abroad.  2) Strongly consider obtaining traveler's insurance, including emergency evacuation insurance. Most plans in the [place] do not cover participants abroad. (see below for resources)  3) Register at the appropriate [place] embassy  or consulate with travel dates so they are aware of your presence in-country and for helpful advice during travel using the [employer] (STEP, GreenNylon.com.cy).  4) Leave contact information with a relative or friend.  5) Keep a Research officer, political party, credit cards in case they become lost or stolen  6) Inform your credit card company that you will be travelling abroad   During travel:  1) If you become ill and need medical advice, the [place] [employer]	[place] of the country you are traveling in general provides a list of [place] speaking doctors.  We are also available on MyChart for remote consultation if you register prior to travel. 2) Avoid motorcycles or scooters when at all possible. Traffic laws in many countries are lax and accidents occur frequently.  3) Do not take any unnecessary risks that you wouldn't do at home.   Resources:  -Country specific information: BlindResource.ca or GreenNylon.com.cy  -Press photographer (DEET, mosquito  nets): [employer], [employer] store, [employer], [employer] insurance options: [place].com; http://clayton-rivera.info/; travelguard.com or [employer], gninsurance.com or info@gninsurance .com, 713-370-3357.   Post Travel:  If you return from your trip ill, call your primary care doctor or our travel clinic @ [phone].   Enjoy your trip and know that with proper pre-travel preparation, most people have an enjoyable and uninterrupted trip!

## 2018-02-20 NOTE — Progress Notes (Signed)
Subjective:   Karen Randolph is a 52 y.o. female who presents to the Infectious Disease clinic for travel consultation. Planned departure date: may 3   Planned return date: may 20 Countries of travel: [place] Areas in country: urban, going to [place], [place], [place] Accommodations: hotel Purpose of travel: work Prior travel out of [place]: yes, [place], for volunteering  Previously has had oral typhoid but greater than 5 yr, has had hep A. tdap in 2000   Objective:   Medications: synthroid   ALL: NKMA    Assessment:   No contraindications to travel. none    Plan:   Pre travel immunizations = will give oral typhoid vaccine, plus tdap vaccine. She is uptodate in hep a  Malaria prophylaxis = [place] appears to be in endemic area. Will give her malarone for her trip, which she has taken before. Gave mosquito bite precautions  Traveler's diarrhea = will give azithro to use if needed. Gave precautions and recommended to get pepto-bismal as well as immodium

## 2018-06-11 ENCOUNTER — Other Ambulatory Visit: Payer: Self-pay | Admitting: Physician Assistant

## 2018-06-11 DIAGNOSIS — E039 Hypothyroidism, unspecified: Secondary | ICD-10-CM

## 2018-06-22 ENCOUNTER — Ambulatory Visit: Payer: Managed Care, Other (non HMO) | Admitting: Physician Assistant

## 2018-06-24 ENCOUNTER — Encounter: Payer: Self-pay | Admitting: Physician Assistant

## 2018-06-24 ENCOUNTER — Ambulatory Visit (INDEPENDENT_AMBULATORY_CARE_PROVIDER_SITE_OTHER): Payer: Managed Care, Other (non HMO) | Admitting: Physician Assistant

## 2018-06-24 VITALS — BP 110/70 | HR 97 | Temp 98.1°F | Resp 16 | Ht 63.5 in | Wt 146.0 lb

## 2018-06-24 DIAGNOSIS — Z6825 Body mass index (BMI) 25.0-25.9, adult: Secondary | ICD-10-CM

## 2018-06-24 DIAGNOSIS — E162 Hypoglycemia, unspecified: Secondary | ICD-10-CM | POA: Diagnosis not present

## 2018-06-24 DIAGNOSIS — R7309 Other abnormal glucose: Secondary | ICD-10-CM

## 2018-06-24 NOTE — Progress Notes (Signed)
Patient: Karen Randolph Female    DOB: 1966-05-04   52 y.o.   MRN: [id number] Visit Date: 06/24/2018  Today's Provider: Mar Daring, PA-C   Chief Complaint  Patient presents with  . Follow-up   Subjective:    HPI Patient here today to go over labs results from [place]. Patient reports that labs were fasting and 5 days prior to labs she was making a "last dish effort" to lose weight and did a crash diet prior to her labs. This also most likely contributed to the low glucose reading. However, glucose was 61 fasting which is still normal range. A1c was also 5.00. Denies symptoms of shaking, fatigue, lightheadedness between meals.   She also has questions about weight plateau. She reports she has been doing well with weight but has recently hit a plateau and fluctuates between 143-146 pounds. She has goal weight loss of mid 130s.      Allergies  Allergen Reactions  . Shellfish Allergy      Current Outpatient Medications:  .  ferrous sulfate 325 (65 FE) MG tablet, Take 1 tablet by mouth daily., Disp: , Rfl:  .  Grape Seed 60 MG CAPS, 1 capsule daily., Disp: , Rfl:  .  levothyroxine (SYNTHROID, LEVOTHROID) 50 MCG tablet, TAKE 1 TABLET BY MOUTH  DAILY BEFORE BREAKFAST, Disp: 90 tablet, Rfl: 1 .  OMEGA-3 FATTY ACIDS PO, 1 tablet daily., Disp: , Rfl:  .  Pediatric Multivitamins-Iron (MULTIPLE VITAMINS-IRON PO), 1 tablet daily., Disp: , Rfl:  .  Vitamin D, Cholecalciferol, 400 UNITS TABS, Take 1 tablet by mouth daily., Disp: , Rfl:  .  atovaquone-proguanil (MALARONE) 250-100 MG TABS tablet, Take 1 tablet by mouth daily. Start taking when u arrive to [place]. Take daily on full stomach until complete (Patient not taking: Reported on 06/24/2018), Disp: 24 tablet, Rfl: 0 .  azithromycin (ZITHROMAX) 500 MG tablet, Take 1 tablet (500 mg total) by mouth daily. If you have 4+ watery diarrhea/24hrs. Can stop taking if diarrhea resolves (Patient not taking: Reported on 06/24/2018),  Disp: 5 tablet, Rfl: 0 .  typhoid (VIVOTIF) DR capsule, Take 1 capsule by mouth every other day. Keep refrigerated. Take with room temp water on empty stomach (Patient not taking: Reported on 06/24/2018), Disp: 4 capsule, Rfl: 0  Review of Systems  Constitutional: Negative.   Respiratory: Negative.   Cardiovascular: Negative.   Gastrointestinal: Negative.   Neurological: Negative.     Social History   Tobacco Use  . Smoking status: Never Smoker  . Smokeless tobacco: Never Used  Substance Use Topics  . Alcohol use: No    Alcohol/week: 0.0 oz   Objective:   Wt 146 lb (66.2 kg)   BMI 25.06 kg/m  Vitals:   06/24/18 1328  Weight: 146 lb (66.2 kg)     Physical Exam  Constitutional: She appears well-developed and well-nourished. No distress.  Neck: Normal range of motion. Neck supple.  Cardiovascular: Normal rate, regular rhythm and normal heart sounds. Exam reveals no gallop and no friction rub.  No murmur heard. Pulmonary/Chest: Effort normal and breath sounds normal. No respiratory distress. She has no wheezes. She has no rales.  Skin: She is not diaphoretic.  Vitals reviewed.      Assessment & Plan:     1. Low glucose level Advised of normal labs. Patient denies any symptoms of hypoglycemia.   2. BMI 25.0-25.9,adult Discussed in detail plateaus. Increase caloric intake by 200-500 calories, increase water intake  and change exercise routine up some. Also discussed carb cycling for patient as well.        Mar Daring, PA-C  [facility]

## 2018-07-27 ENCOUNTER — Encounter: Payer: Self-pay | Admitting: Family Medicine

## 2018-07-27 ENCOUNTER — Ambulatory Visit (INDEPENDENT_AMBULATORY_CARE_PROVIDER_SITE_OTHER): Payer: Managed Care, Other (non HMO) | Admitting: Family Medicine

## 2018-07-27 VITALS — BP 90/56 | HR 81 | Temp 98.6°F | Resp 15 | Wt 145.0 lb

## 2018-07-27 DIAGNOSIS — S39012A Strain of muscle, fascia and tendon of lower back, initial encounter: Secondary | ICD-10-CM | POA: Diagnosis not present

## 2018-07-27 MED ORDER — CYCLOBENZAPRINE HCL 5 MG PO TABS: 5.0000 mg | ORAL_TABLET | Freq: Three times a day (TID) | ORAL | 0 refills | Status: DC | PRN

## 2018-07-27 NOTE — Patient Instructions (Signed)
Discussed taking ibuprofen 800 mg 3 x day with food and up to 3000 mg Tylenol daily. Let me know if not improving over the next 1-2 weeks.

## 2018-07-27 NOTE — Progress Notes (Signed)
  Subjective:     Patient ID: Karen Randolph, female   DOB: 12-07-1966, 52 y.o.   MRN: [id number] Chief Complaint  Patient presents with  . Back Pain    Patient comes in office today with complaints of lower back pain for the past several month intermittent, patient states in the past 3 days pain had been worse. Patient describes pain as sharp and throbbing, she has been taking otc Advil and Tylenol for relief.    HPI States she was doing house cleaning involving a lot of bending prior to the onset of her sx. No radiation of pain, numbness or tingling reported.Has been taking ibuprofen 400 mg and Tylenol for her sx.  Review of Systems     Objective:   Physical Exam  Constitutional: She appears well-developed and well-nourished. Distressed: spasms when changing positions.  Muscle strength in lower extremities 5/5. SLR's to 90 degrees without radiation of back pain. Localizes pain across her lower lumbar area.     Assessment:    1. Strain of lumbar region, initial encounter: rx for cyclobenzaprine    Plan:    Discussed increasing ibuprofen to 800 mg. 3 x day with food and Tylenol up to 3000 mg/day if needed.

## 2018-08-27 ENCOUNTER — Ambulatory Visit (INDEPENDENT_AMBULATORY_CARE_PROVIDER_SITE_OTHER): Payer: Managed Care, Other (non HMO) | Admitting: Physician Assistant

## 2018-08-27 ENCOUNTER — Encounter: Payer: Self-pay | Admitting: Physician Assistant

## 2018-08-27 VITALS — BP 120/80 | HR 60 | Temp 98.3°F | Resp 16 | Ht 64.0 in | Wt 144.6 lb

## 2018-08-27 DIAGNOSIS — Z1239 Encounter for other screening for malignant neoplasm of breast: Secondary | ICD-10-CM

## 2018-08-27 DIAGNOSIS — Z0000 Encounter for general adult medical examination without abnormal findings: Secondary | ICD-10-CM

## 2018-08-27 DIAGNOSIS — Z23 Encounter for immunization: Secondary | ICD-10-CM

## 2018-08-27 DIAGNOSIS — Z1231 Encounter for screening mammogram for malignant neoplasm of breast: Secondary | ICD-10-CM | POA: Diagnosis not present

## 2018-08-27 DIAGNOSIS — Z1211 Encounter for screening for malignant neoplasm of colon: Secondary | ICD-10-CM

## 2018-08-27 DIAGNOSIS — E039 Hypothyroidism, unspecified: Secondary | ICD-10-CM

## 2018-08-27 DIAGNOSIS — Z124 Encounter for screening for malignant neoplasm of cervix: Secondary | ICD-10-CM | POA: Diagnosis not present

## 2018-08-27 DIAGNOSIS — Z136 Encounter for screening for cardiovascular disorders: Secondary | ICD-10-CM

## 2018-08-27 DIAGNOSIS — Z13220 Encounter for screening for lipoid disorders: Secondary | ICD-10-CM

## 2018-08-27 DIAGNOSIS — R7309 Other abnormal glucose: Secondary | ICD-10-CM

## 2018-08-27 DIAGNOSIS — D2339 Other benign neoplasm of skin of other parts of face: Secondary | ICD-10-CM

## 2018-08-27 DIAGNOSIS — E559 Vitamin D deficiency, unspecified: Secondary | ICD-10-CM

## 2018-08-27 NOTE — Addendum Note (Signed)
Addended by: Mar Daring on: 08/27/2018 04:35 PM   Modules accepted: Orders

## 2018-08-27 NOTE — Progress Notes (Addendum)
Patient: Karen Randolph, Female    DOB: 07/07/1966, 52 y.o.   MRN: [id number] Visit Date: 08/27/2018  Today's Provider: Mar Daring, PA-C   Chief Complaint  Patient presents with  . Annual Exam   Subjective:    Annual physical exam Karen Randolph is a 52 y.o. female who presents today for health maintenance and complete physical. She feels well. She reports exercising. She reports she is sleeping well.  Last CPE:08/272018 Mammogram:11/14/2017- Normal. -----------------------------------------------------------------   Review of Systems  Constitutional: Negative.   HENT: Negative.   Eyes: Negative.   Respiratory: Negative.   Cardiovascular: Negative.   Gastrointestinal: Negative.   Endocrine: Negative.   Genitourinary: Negative.   Musculoskeletal: Negative.   Skin: Negative.   Allergic/Immunologic: Negative.   Neurological: Negative.   Hematological: Negative.   Psychiatric/Behavioral: Negative.     Social History      She  reports that she has never smoked. She has never used smokeless tobacco. She reports that she does not drink alcohol or use drugs.       Social History   Socioeconomic History  . Marital status: Single    Spouse name: Not on file  . Number of children: Not on file  . Years of education: Not on file  . Highest education level: Not on file  Occupational History  . Not on file  Social Needs  . Financial resource strain: Not on file  . Food insecurity:    Worry: Not on file    Inability: Not on file  . Transportation needs:    Medical: Not on file    Non-medical: Not on file  Tobacco Use  . Smoking status: Never Smoker  . Smokeless tobacco: Never Used  Substance and Sexual Activity  . Alcohol use: No    Alcohol/week: 0.0 standard drinks  . Drug use: No  . Sexual activity: Never  Lifestyle  . Physical activity:    Days per week: Not on file    Minutes per session: Not on file  . Stress: Not on file    Relationships  . Social connections:    Talks on phone: Not on file    Gets together: Not on file    Attends religious service: Not on file    Active member of club or organization: Not on file    Attends meetings of clubs or organizations: Not on file    Relationship status: Not on file  Other Topics Concern  . Not on file  Social History Narrative  . Not on file    History reviewed. No pertinent past medical history.   Patient Active Problem List   Diagnosis Date Noted  . Hypothyroidism 08/03/2015  . Anemia, iron deficiency 07/17/2015  . Excess, menstruation 07/17/2015  . Avitaminosis D 07/06/2010  . 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 BRAIN TUMOR EXCISION  2007,2010    Family History        Family Status  Relation Name Status  . Mother  Deceased at age 20  . Father  Deceased at age 69  . MGM  Deceased  . MGF  Deceased  . PGM  Deceased  . PGF  Deceased  . Brother  (Not Specified)        Her family history includes Alcohol abuse in her mother; CAD in her father; CVA in her father; Healthy  in her brother; Heart attack in her father; Hypertension in her father.      Allergies  Allergen Reactions  . Shellfish Allergy      Current Outpatient Medications:  .  cyclobenzaprine (FLEXERIL) 5 MG tablet, Take 1 tablet (5 mg total) by mouth 3 (three) times daily as needed for muscle spasms., Disp: 21 tablet, Rfl: 0 .  ferrous sulfate 325 (65 FE) MG tablet, Take 1 tablet by mouth daily., Disp: , Rfl:  .  Grape Seed 60 MG CAPS, 1 capsule daily., Disp: , Rfl:  .  levothyroxine (SYNTHROID, LEVOTHROID) 50 MCG tablet, TAKE 1 TABLET BY MOUTH  DAILY BEFORE BREAKFAST, Disp: 90 tablet, Rfl: 1 .  OMEGA-3 FATTY ACIDS PO, 1 tablet daily., Disp: , Rfl:  .  Pediatric Multivitamins-Iron (MULTIPLE VITAMINS-IRON PO), 1 tablet daily., Disp: , Rfl:  .  Vitamin D, Cholecalciferol, 400 UNITS  TABS, Take 1 tablet by mouth daily., Disp: , Rfl:    Patient Care Team: Mar Daring, PA-C as PCP - General (Family Medicine)      Objective:   Vitals: BP 120/80 (BP Location: Right Arm, Patient Position: Sitting, Cuff Size: Normal)   Pulse 60   Temp 98.3 F (36.8 C) (Oral)   Resp 16   Ht 5\' 4"  (1.626 m)   Wt 144 lb 9.6 oz (65.6 kg)   BMI 24.82 kg/m    Vitals:   08/27/18 1421  BP: 120/80  Pulse: 60  Resp: 16  Temp: 98.3 F (36.8 C)  TempSrc: Oral  Weight: 144 lb 9.6 oz (65.6 kg)  Height: 5\' 4"  (1.626 m)     Physical Exam  Constitutional: She is oriented to person, place, and time. She appears well-developed and well-nourished. No distress.  HENT:  Head: Normocephalic and atraumatic.    Right Ear: Hearing, tympanic membrane, external ear and ear canal normal.  Left Ear: Hearing, tympanic membrane, external ear and ear canal normal.  Nose: Nose normal.  Mouth/Throat: Uvula is midline, oropharynx is clear and moist and mucous membranes are normal. No oropharyngeal exudate.  Eyes: Pupils are equal, round, and reactive to light. Conjunctivae and EOM are normal. Right eye exhibits no discharge. Left eye exhibits no discharge. No scleral icterus.  Neck: Normal range of motion. Neck supple. No JVD present. Carotid bruit is not present. No tracheal deviation present. No thyromegaly present.  Cardiovascular: Normal rate, regular rhythm, normal heart sounds and intact distal pulses. Exam reveals no gallop and no friction rub.  No murmur heard. Pulmonary/Chest: Effort normal and breath sounds normal. No respiratory distress. She has no wheezes. She has no rales. She exhibits no tenderness. Right breast exhibits no inverted nipple, no mass, no nipple discharge, no skin change and no tenderness. Left breast exhibits no inverted nipple, no mass, no nipple discharge, no skin change and no tenderness. No breast tenderness, discharge or bleeding. Breasts are symmetrical.    Abdominal: Soft. Bowel sounds are normal. She exhibits no distension and no mass. There is no tenderness. There is no rebound and no guarding. Hernia confirmed negative in the right inguinal area and confirmed negative in the left inguinal area.  Genitourinary: Rectum normal, vagina normal and uterus normal. No breast tenderness, discharge or bleeding. Pelvic exam was performed with patient supine. There is no rash, tenderness, lesion or injury on the right labia. There is no rash, tenderness, lesion or injury on the left labia. Cervix exhibits no motion tenderness, no discharge and no friability. Right adnexum displays no mass,  no tenderness and no fullness. Left adnexum displays no mass, no tenderness and no fullness. No erythema, tenderness or bleeding in the vagina. No signs of injury around the vagina. No vaginal discharge found.    Musculoskeletal: Normal range of motion. She exhibits no edema or tenderness.  Lymphadenopathy:    She has no cervical adenopathy.       Right: No inguinal adenopathy present.       Left: No inguinal adenopathy present.  Neurological: She is alert and oriented to person, place, and time. She has normal reflexes. No cranial nerve deficit. Coordination normal.  Skin: Skin is warm and dry. No rash noted. She is not diaphoretic.  Psychiatric: She has a normal mood and affect. Her behavior is normal. Judgment and thought content normal.  Vitals reviewed.    Depression Screen PHQ 2/9 Scores 08/04/2017 08/02/2015  PHQ - 2 Score 0 0      Assessment & Plan:     Routine Health Maintenance and Physical Exam  Exercise Activities and Dietary recommendations Goals   None     Immunization History  Administered Date(s) Administered  . Influenza,inj,Quad PF,6+ Mos 10/10/2015, 08/04/2017  . Tdap 04/07/2008, 02/20/2018    Health Maintenance  Topic Date Due  . PAP SMEAR  01/19/1989  . COLONOSCOPY  01/19/2018  . INFLUENZA VACCINE  07/09/2018  . MAMMOGRAM   11/15/2019  . TETANUS/TDAP  02/21/2028  . HIV Screening  Completed     Discussed health benefits of physical activity, and encouraged her to engage in regular exercise appropriate for her age and condition.    1. Annual physical exam Normal physical exam today. Will check labs as below and f/u pending lab results. If labs are stable and WNL she will not need to have these rechecked for one year at her next annual physical exam. She is to call the office in the meantime if she has any acute issue, questions or concerns. - CBC with Differential/Platelet - Comprehensive metabolic panel  2. Breast cancer screening Breast exam today was normal. There is no family history of breast cancer. She does perform regular self breast exams. Mammogram was ordered as below. Information for [facility] was given to patient so she may schedule her mammogram at her convenience. - MM 3D SCREEN BREAST BILATERAL; Future  3. Cervical cancer screening Pap collected today. Will send as below and f/u pending results. - Pap IG and HPV (high risk) DNA detection  4. Colon cancer screening Referral placed for colonoscopy.  - Ambulatory referral to Gastroenterology  5. Hypothyroidism, unspecified type Stable. Continue levothyroxine 43mcg. Will check labs as below and f/u pending results. - TSH  6. Elevated hemoglobin A1c Stable. Diet controlled. Will check labs as below and f/u pending results. - Hemoglobin A1c  7. Encounter for lipid screening for cardiovascular disease Will check labs as below and f/u pending results. - Lipid panel  8. Vitamin D deficiency Continue Vit D 400 IU daily. Will check labs as below and f/u pending results.  - Vitamin D (25 hydroxy)  9. Need for influenza vaccination Flu vaccine given today without complication. Patient sat upright for 15 minutes to check for adverse reaction before being released. - Flu Vaccine QUAD 36+ mos  IM  --------------------------------------------------------------------    Mar Daring, PA-C  [facility]

## 2018-08-27 NOTE — Patient Instructions (Addendum)
Patellofemoral Pain Syndrome Patellofemoral pain syndrome is a condition that involves a softening or breakdown of the tissue (cartilage) on the underside of your kneecap (patella). This causes pain in the front of the knee. The condition is also called runner's knee or chondromalacia patella. Patellofemoral pain syndrome is most common in young adults who are active in sports. Your knee is the largest joint in your body. The patella covers the front of your knee and is attached to muscles above and below your knee. The underside of the patella is covered with a smooth type of cartilage (synovium). The smooth surface helps the patella glide easily when you move your knee. Patellofemoral pain syndrome causes swelling in the joint linings and bone surfaces in your knee. What are the causes? Patellofemoral pain syndrome can be caused by:  Overuse.  Poor alignment of your knee joints.  Weak leg muscles.  A direct blow to your kneecap.  What increases the risk? You may be at risk for patellofemoral pain syndrome if you:  Do a lot of activities that can wear down your kneecap. These include: ? Running. ? Squatting. ? Climbing stairs.  Start a new physical activity or exercise program.  Wear shoes that do not fit well.  Do not have good leg strength.  Are overweight.  What are the signs or symptoms? Knee pain is the most common symptom of patellofemoral pain syndrome. This may feel like a dull, aching pain underneath your patella, in the front of your knee. There may be a popping or cracking sound when you move your knee. Pain may get worse with:  Exercise.  Climbing stairs.  Running.  Jumping.  Squatting.  Kneeling.  Sitting for a long time.  Moving or pushing on your patella.  How is this diagnosed? Your health care provider may be able to diagnose patellofemoral pain syndrome from your symptoms and medical history. You may be asked about your recent physical activities  and which ones cause knee pain. Your health care provider may do a physical exam with certain tests to confirm the diagnosis. These may include:  Moving your patella back and forth.  Checking your range of knee motion.  Having you squat or jump to see if you have pain.  Checking the strength of your leg muscles.  An MRI of the knee may also be done. How is this treated? Patellofemoral pain syndrome can usually be treated at home with rest, ice, compression, and elevation (RICE). Other treatments may include:  Nonsteroidal anti-inflammatory drugs (NSAIDs).  Physical therapy to stretch and strengthen your leg muscles.  Shoe inserts (orthotics) to take stress off your knee.  A knee brace or knee support.  Surgery to remove damaged cartilage or move the patella to a better position. The need for surgery is rare.  Follow these instructions at home:  Take medicines only as directed by your health care provider.  Rest your knee. ? When resting, keep your knee raised above the level of your heart. ? Avoid activities that cause knee pain.  Apply ice to the injured area: ? Put ice in a plastic bag. ? Place a towel between your skin and the bag. ? Leave the ice on for 20 minutes, 2-3 times a day.  Use splints, braces, knee supports, or walking aids as directed by your health care provider.  Perform stretching and strengthening exercises as directed by your health care provider or physical therapist.  Keep all follow-up visits as directed by your health care  provider. This is important. Contact a health care provider if:  Your symptoms get worse.  You are not improving with home care. This information is not intended to replace advice given to you by your health care provider. Make sure you discuss any questions you have with your health care provider. Document Released: 11/13/2009 Document Revised: 05/02/2016 Document Reviewed: 02/14/2014 Elsevier Interactive Patient Education   2018 [employer], Female Adopting a healthy lifestyle and getting preventive care can go a long way to promote health and wellness. Talk with your health care provider about what schedule of regular examinations is right for you. This is a good chance for you to check in with your provider about disease prevention and staying healthy. In between checkups, there are plenty of things you can do on your own. Experts have done a lot of research about which lifestyle changes and preventive measures are most likely to keep you healthy. Ask your health care provider for more information. Weight and diet Eat a healthy diet  Be sure to include plenty of vegetables, fruits, low-fat dairy products, and lean protein.  Do not eat a lot of foods high in solid fats, added sugars, or salt.  Get regular exercise. This is one of the most important things you can do for your health. ? Most adults should exercise for at least 150 minutes each week. The exercise should increase your heart rate and make you sweat (moderate-intensity exercise). ? Most adults should also do strengthening exercises at least twice a week. This is in addition to the moderate-intensity exercise.  Maintain a healthy weight  Body mass index (BMI) is a measurement that can be used to identify possible weight problems. It estimates body fat based on height and weight. Your health care provider can help determine your BMI and help you achieve or maintain a healthy weight.  For females 47 years of age and older: ? A BMI below 18.5 is considered underweight. ? A BMI of 18.5 to 24.9 is normal. ? A BMI of 25 to 29.9 is considered overweight. ? A BMI of 30 and above is considered obese.  Watch levels of cholesterol and blood lipids  You should start having your blood tested for lipids and cholesterol at 50 years of age, then have this test every 5 years.  You may need to have your cholesterol levels checked more  often if: ? Your lipid or cholesterol levels are high. ? You are older than 50 years of age. ? You are at high risk for heart disease.  Cancer screening Lung Cancer  Lung cancer screening is recommended for adults 102-61 years old who are at high risk for lung cancer because of a history of smoking.  A yearly low-dose CT scan of the lungs is recommended for people who: ? Currently smoke. ? Have quit within the past 15 years. ? Have at least a 30-pack-year history of smoking. A pack year is smoking an average of one pack of cigarettes a day for 1 year.  Yearly screening should continue until it has been 15 years since you quit.  Yearly screening should stop if you develop a health problem that would prevent you from having lung cancer treatment.  Breast Cancer  Practice breast self-awareness. This means understanding how your breasts normally appear and feel.  It also means doing regular breast self-exams. Let your health care provider know about any changes, no matter how small.  If you are in your 19s  or 96s, you should have a clinical breast exam (CBE) by a health care provider every 1-3 years as part of a regular health exam.  If you are 19 or older, have a CBE every year. Also consider having a breast X-ray (mammogram) every year.  If you have a family history of breast cancer, talk to your health care provider about genetic screening.  If you are at high risk for breast cancer, talk to your health care provider about having an MRI and a mammogram every year.  Breast cancer gene (BRCA) assessment is recommended for women who have family members with BRCA-related cancers. BRCA-related cancers include: ? Breast. ? Ovarian. ? Tubal. ? Peritoneal cancers.  Results of the assessment will determine the need for genetic counseling and BRCA1 and BRCA2 testing.  Cervical Cancer Your health care provider may recommend that you be screened regularly for cancer of the pelvic organs  (ovaries, uterus, and vagina). This screening involves a pelvic examination, including checking for microscopic changes to the surface of your cervix (Pap test). You may be encouraged to have this screening done every 3 years, beginning at age 35.  For women ages 81-65, health care providers may recommend pelvic exams and Pap testing every 3 years, or they may recommend the Pap and pelvic exam, combined with testing for human papilloma virus (HPV), every 5 years. Some types of HPV increase your risk of cervical cancer. Testing for HPV may also be done on women of any age with unclear Pap test results.  Other health care providers may not recommend any screening for nonpregnant women who are considered low risk for pelvic cancer and who do not have symptoms. Ask your health care provider if a screening pelvic exam is right for you.  If you have had past treatment for cervical cancer or a condition that could lead to cancer, you need Pap tests and screening for cancer for at least 20 years after your treatment. If Pap tests have been discontinued, your risk factors (such as having a new sexual partner) need to be reassessed to determine if screening should resume. Some women have medical problems that increase the chance of getting cervical cancer. In these cases, your health care provider may recommend more frequent screening and Pap tests.  Colorectal Cancer  This type of cancer can be detected and often prevented.  Routine colorectal cancer screening usually begins at 50 years of age and continues through 50 years of age.  Your health care provider may recommend screening at an earlier age if you have risk factors for colon cancer.  Your health care provider may also recommend using home test kits to check for hidden blood in the stool.  A small camera at the end of a tube can be used to examine your colon directly (sigmoidoscopy or colonoscopy). This is done to check for the earliest forms of  colorectal cancer.  Routine screening usually begins at age 75.  Direct examination of the colon should be repeated every 5-10 years through 50 years of age. However, you may need to be screened more often if early forms of precancerous polyps or small growths are found.  Skin Cancer  Check your skin from head to toe regularly.  Tell your health care provider about any new moles or changes in moles, especially if there is a change in a mole's shape or color.  Also tell your health care provider if you have a mole that is larger than the size of a  pencil eraser.  Always use sunscreen. Apply sunscreen liberally and repeatedly throughout the day.  Protect yourself by wearing long sleeves, pants, a wide-brimmed hat, and sunglasses whenever you are outside.  Heart disease, diabetes, and high blood pressure  High blood pressure causes heart disease and increases the risk of stroke. High blood pressure is more likely to develop in: ? People who have blood pressure in the high end of the normal range (130-139/85-89 mm Hg). ? People who are overweight or obese. ? People who are African American.  If you are 5-68 years of age, have your blood pressure checked every 3-5 years. If you are 55 years of age or older, have your blood pressure checked every year. You should have your blood pressure measured twice-once when you are at a hospital or clinic, and once when you are not at a hospital or clinic. Record the average of the two measurements. To check your blood pressure when you are not at a hospital or clinic, you can use: ? An automated blood pressure machine at a pharmacy. ? A home blood pressure monitor.  If you are between 67 years and 88 years old, ask your health care provider if you should take aspirin to prevent strokes.  Have regular diabetes screenings. This involves taking a blood sample to check your fasting blood sugar level. ? If you are at a normal weight and have a low risk  for diabetes, have this test once every three years after 50 years of age. ? If you are overweight and have a high risk for diabetes, consider being tested at a younger age or more often. Preventing infection Hepatitis B  If you have a higher risk for hepatitis B, you should be screened for this virus. You are considered at high risk for hepatitis B if: ? You were born in a country where hepatitis B is common. Ask your health care provider which countries are considered high risk. ? Your parents were born in a high-risk country, and you have not been immunized against hepatitis B (hepatitis B vaccine). ? You have HIV or AIDS. ? You use needles to inject street drugs. ? You live with someone who has hepatitis B. ? You have had sex with someone who has hepatitis B. ? You get hemodialysis treatment. ? You take certain medicines for conditions, including cancer, organ transplantation, and autoimmune conditions.  Hepatitis C  Blood testing is recommended for: ? Everyone born from [birth year] through 1963. ? Anyone with known risk factors for hepatitis C.  Sexually transmitted infections (STIs)  You should be screened for sexually transmitted infections (STIs) including gonorrhea and chlamydia if: ? You are sexually active and are younger than 50 years of age. ? You are older than 50 years of age and your health care provider tells you that you are at risk for this type of infection. ? Your sexual activity has changed since you were last screened and you are at an increased risk for chlamydia or gonorrhea. Ask your health care provider if you are at risk.  If you do not have HIV, but are at risk, it may be recommended that you take a prescription medicine daily to prevent HIV infection. This is called pre-exposure prophylaxis (PrEP). You are considered at risk if: ? You are sexually active and do not regularly use condoms or know the HIV status of your partner(s). ? You take drugs by  injection. ? You are sexually active with a partner who has HIV.  Talk with your health care provider about whether you are at high risk of being infected with HIV. If you choose to begin PrEP, you should first be tested for HIV. You should then be tested every 3 months for as long as you are taking PrEP. Pregnancy  If you are premenopausal and you may become pregnant, ask your health care provider about preconception counseling.  If you may become pregnant, take 400 to 800 micrograms (mcg) of folic acid every day.  If you want to prevent pregnancy, talk to your health care provider about birth control (contraception). Osteoporosis and menopause  Osteoporosis is a disease in which the bones lose minerals and strength with aging. This can result in serious bone fractures. Your risk for osteoporosis can be identified using a bone density scan.  If you are 1 years of age or older, or if you are at risk for osteoporosis and fractures, ask your health care provider if you should be screened.  Ask your health care provider whether you should take a calcium or vitamin D supplement to lower your risk for osteoporosis.  Menopause may have certain physical symptoms and risks.  Hormone replacement therapy may reduce some of these symptoms and risks. Talk to your health care provider about whether hormone replacement therapy is right for you. Follow these instructions at home:  Schedule regular health, dental, and eye exams.  Stay current with your immunizations.  Do not use any tobacco products including cigarettes, chewing tobacco, or electronic cigarettes.  If you are pregnant, do not drink alcohol.  If you are breastfeeding, limit how much and how often you drink alcohol.  Limit alcohol intake to no more than 1 drink per day for nonpregnant women. One drink equals 12 ounces of beer, 5 ounces of wine, or 1 ounces of hard liquor.  Do not use street drugs.  Do not share needles.  Ask  your health care provider for help if you need support or information about quitting drugs.  Tell your health care provider if you often feel depressed.  Tell your health care provider if you have ever been abused or do not feel safe at home. This information is not intended to replace advice given to you by your health care provider. Make sure you discuss any questions you have with your health care provider. Document Released: 06/10/2011 Document Revised: 05/02/2016 Document Reviewed: 08/29/2015 Elsevier Interactive Patient Education  [employer].

## 2018-09-01 ENCOUNTER — Telehealth: Payer: Self-pay

## 2018-09-01 LAB — PAP IG AND HPV HIGH-RISK
HPV, high-risk: NEGATIVE
PAP Smear Comment: 0

## 2018-09-01 NOTE — Telephone Encounter (Signed)
-----   Message from Mar Daring, [place] sent at 09/01/2018  1:45 PM EDT ----- Pap is normal, HPV negative.  Will repeat in 3-5 years.

## 2018-09-01 NOTE — Telephone Encounter (Signed)
lmtcb

## 2018-09-02 NOTE — Telephone Encounter (Signed)
LMTCB

## 2018-09-04 NOTE — Telephone Encounter (Signed)
Pt returned call  ° °thanks teri °

## 2018-09-04 NOTE — Telephone Encounter (Signed)
Patient advised as directed below.  Thanks,  -Markeesha Char 

## 2018-09-07 ENCOUNTER — Other Ambulatory Visit: Payer: Self-pay

## 2018-09-07 DIAGNOSIS — Z1211 Encounter for screening for malignant neoplasm of colon: Secondary | ICD-10-CM

## 2018-09-30 LAB — CBC WITH DIFFERENTIAL/PLATELET
BASOS ABS: 0 10*3/uL (ref 0.0–0.2)
Basos: 0 %
EOS (ABSOLUTE): 0 10*3/uL (ref 0.0–0.4)
Eos: 1 %
Hematocrit: 34.6 % (ref 34.0–46.6)
Hemoglobin: 11.6 g/dL (ref 11.1–15.9)
IMMATURE GRANULOCYTES: 0 %
Immature Grans (Abs): 0 10*3/uL (ref 0.0–0.1)
Lymphocytes Absolute: 1.1 10*3/uL (ref 0.7–3.1)
Lymphs: 35 %
MCH: 30 pg (ref 26.6–33.0)
MCHC: 33.5 g/dL (ref 31.5–35.7)
MCV: 89 fL (ref 79–97)
MONOS ABS: 0.4 10*3/uL (ref 0.1–0.9)
Monocytes: 12 %
NEUTROS PCT: 52 %
Neutrophils Absolute: 1.6 10*3/uL (ref 1.4–7.0)
Platelets: 359 10*3/uL (ref 150–450)
RBC: 3.87 x10E6/uL (ref 3.77–5.28)
RDW: 16.7 % — ABNORMAL HIGH (ref 12.3–15.4)
WBC: 3.1 10*3/uL — ABNORMAL LOW (ref 3.4–10.8)

## 2018-09-30 LAB — COMPREHENSIVE METABOLIC PANEL
ALBUMIN: 4.4 g/dL (ref 3.5–5.5)
ALK PHOS: 67 IU/L (ref 39–117)
ALT: 20 IU/L (ref 0–32)
AST: 22 IU/L (ref 0–40)
Albumin/Globulin Ratio: 1.8 (ref 1.2–2.2)
BUN/Creatinine Ratio: 7 — ABNORMAL LOW (ref 9–23)
BUN: 6 mg/dL (ref 6–24)
CHLORIDE: 102 mmol/L (ref 96–106)
CO2: 23 mmol/L (ref 20–29)
Calcium: 9.3 mg/dL (ref 8.7–10.2)
Creatinine, Ser: 0.83 mg/dL (ref 0.57–1.00)
GFR calc non Af Amer: 82 mL/min/{1.73_m2} (ref >59)
GFR, EST AFRICAN AMERICAN: 95 mL/min/{1.73_m2} (ref >59)
GLOBULIN, TOTAL: 2.4 g/dL (ref 1.5–4.5)
GLUCOSE: 85 mg/dL (ref 65–99)
Potassium: 4.2 mmol/L (ref 3.5–5.2)
Sodium: 140 mmol/L (ref 134–144)
TOTAL PROTEIN: 6.8 g/dL (ref 6.0–8.5)

## 2018-09-30 LAB — LIPID PANEL
CHOL/HDL RATIO: 2.2 ratio (ref 0.0–4.4)
Cholesterol, Total: 217 mg/dL — ABNORMAL HIGH (ref 100–199)
HDL: 100 mg/dL (ref >39)
LDL CALC: 104 mg/dL — AB (ref 0–99)
TRIGLYCERIDES: 65 mg/dL (ref 0–149)
VLDL Cholesterol Cal: 13 mg/dL (ref 5–40)

## 2018-09-30 LAB — VITAMIN D 25 HYDROXY (VIT D DEFICIENCY, FRACTURES): VIT D 25 HYDROXY: 42.9 ng/mL (ref 30.0–100.0)

## 2018-09-30 LAB — HEMOGLOBIN A1C
Est. average glucose Bld gHb Est-mCnc: 91 mg/dL
HEMOGLOBIN A1C: 4.8 % (ref 4.8–5.6)

## 2018-09-30 LAB — TSH: TSH: 2.3 u[IU]/mL (ref 0.450–4.500)

## 2018-10-02 ENCOUNTER — Telehealth: Payer: Self-pay

## 2018-10-02 NOTE — Telephone Encounter (Signed)
Tried calling patient and no answer. Mailbox was full and unable to leave VM. Will try again later.

## 2018-10-02 NOTE — Telephone Encounter (Signed)
-----   Message from Jerrol Banana., MD sent at 10/02/2018  9:34 AM EDT ----- Labs stable.

## 2018-10-07 NOTE — Telephone Encounter (Signed)
Tried calling; pt's mailbox is full.   Thanks,   -Laura  

## 2018-10-07 NOTE — Telephone Encounter (Signed)
Advised patient of results.  

## 2018-10-10 ENCOUNTER — Encounter: Payer: Self-pay | Admitting: Anesthesiology

## 2018-10-12 ENCOUNTER — Ambulatory Visit: Payer: Managed Care, Other (non HMO) | Admitting: Anesthesiology

## 2018-10-12 ENCOUNTER — Ambulatory Visit
Admission: RE | Admit: 2018-10-12 | Discharge: 2018-10-12 | Disposition: A | Discharge status: Home / Self Care | Payer: Managed Care, Other (non HMO) | Source: Ambulatory Visit | Attending: Gastroenterology | Admitting: Gastroenterology

## 2018-10-12 ENCOUNTER — Encounter
Admission: RE | Disposition: A | Discharge status: Home / Self Care | Payer: Self-pay | Source: Ambulatory Visit | Attending: Gastroenterology

## 2018-10-12 DIAGNOSIS — Z1211 Encounter for screening for malignant neoplasm of colon: Secondary | ICD-10-CM | POA: Insufficient documentation

## 2018-10-12 DIAGNOSIS — D649 Anemia, unspecified: Secondary | ICD-10-CM | POA: Diagnosis not present

## 2018-10-12 DIAGNOSIS — E039 Hypothyroidism, unspecified: Secondary | ICD-10-CM | POA: Insufficient documentation

## 2018-10-12 DIAGNOSIS — Z79899 Other long term (current) drug therapy: Secondary | ICD-10-CM | POA: Insufficient documentation

## 2018-10-12 DIAGNOSIS — Z79890 Hormone replacement therapy (postmenopausal): Secondary | ICD-10-CM | POA: Insufficient documentation

## 2018-10-12 HISTORY — PX: COLONOSCOPY WITH PROPOFOL: SHX5780

## 2018-10-12 LAB — POCT PREGNANCY, URINE: Preg Test, Ur: NEGATIVE

## 2018-10-12 SURGERY — COLONOSCOPY WITH PROPOFOL
Anesthesia: General

## 2018-10-12 MED ORDER — PROPOFOL 500 MG/50ML IV EMUL
INTRAVENOUS | Status: AC
  Filled 2018-10-12: qty 50

## 2018-10-12 MED ORDER — SODIUM CHLORIDE 0.9 % IV SOLN
INTRAVENOUS | Status: DC
  Administered 2018-10-12: 1000 mL via INTRAVENOUS

## 2018-10-12 MED ORDER — LIDOCAINE HCL (PF) 1 % IJ SOLN
2.0000 mL | Freq: Once | INTRAMUSCULAR | Status: AC
  Administered 2018-10-12: 0.3 mL via INTRADERMAL

## 2018-10-12 MED ORDER — PROPOFOL 10 MG/ML IV BOLUS
INTRAVENOUS | Status: DC | PRN
  Administered 2018-10-12: 70 mg via INTRAVENOUS

## 2018-10-12 MED ORDER — LIDOCAINE HCL (PF) 1 % IJ SOLN
INTRAMUSCULAR | Status: AC
  Administered 2018-10-12: 0.3 mL via INTRADERMAL
  Filled 2018-10-12: qty 2

## 2018-10-12 MED ORDER — PROPOFOL 500 MG/50ML IV EMUL
INTRAVENOUS | Status: DC | PRN
  Administered 2018-10-12: 150 ug/kg/min via INTRAVENOUS

## 2018-10-12 NOTE — Anesthesia Postprocedure Evaluation (Signed)
Anesthesia Post Note  Patient: Karen Randolph  Procedure(s) Performed: COLONOSCOPY WITH PROPOFOL (N/A )  Patient location during evaluation: Endoscopy Anesthesia Type: General Level of consciousness: awake and alert Pain management: pain level controlled Vital Signs Assessment: post-procedure vital signs reviewed and stable Respiratory status: spontaneous breathing, nonlabored ventilation, respiratory function stable and patient connected to nasal cannula oxygen Cardiovascular status: blood pressure returned to baseline and stable Postop Assessment: no apparent nausea or vomiting Anesthetic complications: no     Last Vitals:  Vitals:   10/12/18 0839 10/12/18 0844  BP: 118/77 114/74  Pulse: 85 80  Resp: 18 11  Temp:    SpO2: 100% 100%    Last Pain:  Vitals:   10/12/18 0844  TempSrc:   PainSc: 0-No pain                 Jay Haskew S

## 2018-10-12 NOTE — Op Note (Signed)
[facility] Gastroenterology Patient Name: Karen Randolph Procedure Date: 10/12/2018 7:59 AM MRN: [id number] Account #: [id number] Date of Birth: 06-05-1966 Admit Type: Outpatient Age: 52 Room: [facility] ENDO ROOM 4 Gender: Female Note Status: Finalized Procedure:            Colonoscopy Indications:          Screening for colorectal malignant neoplasm Providers:            Jonathon Bellows MD, MD Referring MD:         Mar Daring (Referring MD) Medicines:            Monitored Anesthesia Care Complications:        No immediate complications. Procedure:            Pre-Anesthesia Assessment:                       - Prior to the procedure, a History and Physical was                        performed, and patient medications, allergies and                        sensitivities were reviewed. The patient's tolerance of                        previous anesthesia was reviewed.                       - ASA Grade Assessment: II - A patient with mild                        systemic disease.                       After obtaining informed consent, the colonoscope was                        passed under direct vision. Throughout the procedure,                        the patient's blood pressure, pulse, and oxygen                        saturations were monitored continuously. The                        Colonoscope was introduced through the anus and                        advanced to the the cecum, identified by the                        appendiceal orifice, IC valve and transillumination.                        The colonoscopy was performed with ease. The patient                        tolerated the procedure well. The quality of the bowel  preparation was good. Findings:      The perianal and digital rectal examinations were normal.      The entire examined colon appeared normal on direct and retroflexion       views. Impression:           - The entire  examined colon is normal on direct and                        retroflexion views.                       - No specimens collected. Recommendation:       - Discharge patient to home (with escort).                       - Resume previous diet.                       - Continue present medications.                       - Repeat colonoscopy in 10 years for screening purposes. Procedure Code(s):    --- Professional ---                       [phone], Colonoscopy, flexible; diagnostic, including                        collection of specimen(s) by brushing or washing, when                        performed (separate procedure) Diagnosis Code(s):    --- Professional ---                       Z12.11, Encounter for screening for malignant neoplasm                        of colon CPT copyright 2018 [employer]. All rights reserved. The codes documented in this report are preliminary and upon coder review may  be revised to meet current compliance requirements. Jonathon Bellows, MD Jonathon Bellows MD, MD 10/12/2018 8:23:53 AM This report has been signed electronically. Number of Addenda: 0 Note Initiated On: 10/12/2018 7:59 AM Scope Withdrawal Time: 0 hours 10 minutes 42 seconds  Total Procedure Duration: 0 hours 13 minutes 20 seconds       [facility]

## 2018-10-12 NOTE — Anesthesia Post-op Follow-up Note (Signed)
Anesthesia QCDR form completed.        

## 2018-10-12 NOTE — H&P (Signed)
Jonathon Bellows, MD [address], [place], [place], [place], [address] [address], [place], [place], [place], [address] Phone: [phone]  Fax: [phone]  Primary Care Physician:  Mar Daring, PA-C   Pre-Procedure History & Physical: HPI:  Karen Randolph is a 52 y.o. female is here for an colonoscopy.   History reviewed. No pertinent past medical history.  Past Surgical History:  Procedure Laterality Date  . BRAIN TUMOR EXCISION  [phone]    Prior to Admission medications   Medication Sig Start Date End Date Taking? Authorizing Provider  cyclobenzaprine (FLEXERIL) 5 MG tablet Take 1 tablet (5 mg total) by mouth 3 (three) times daily as needed for muscle spasms. 07/27/18   Carmon Ginsberg, PA  ferrous sulfate 325 (65 FE) MG tablet Take 1 tablet by mouth daily.    [provider]  Grape Seed 60 MG CAPS 1 capsule daily. 04/13/09   [provider]  levothyroxine (SYNTHROID, LEVOTHROID) 50 MCG tablet TAKE 1 TABLET BY MOUTH  DAILY BEFORE BREAKFAST 06/12/18   Burnette, Clearnce Sorrel, PA-C  OMEGA-3 FATTY ACIDS PO 1 tablet daily. 04/13/09   [provider]  Pediatric Multivitamins-Iron (MULTIPLE VITAMINS-IRON PO) 1 tablet daily. 04/13/09   [provider]  Vitamin D, Cholecalciferol, 400 UNITS TABS Take 1 tablet by mouth daily.    [provider]    Allergies as of 09/08/2018 - Review Complete 08/27/2018  Allergen Reaction Noted  . Shellfish allergy  07/17/2015    Family History  Problem Relation Age of Onset  . Alcohol abuse Mother   . CVA Father   . CAD Father   . Hypertension Father   . Heart attack Father   . Healthy Brother     Social History   Socioeconomic History  . Marital status: Single    Spouse name: Not on file  . Number of children: Not on file  . Years of education: Not on file  . Highest education level: Not on file  Occupational History  . Not on file  Social Needs  . Financial resource strain:  Not on file  . Food insecurity:    Worry: Not on file    Inability: Not on file  . Transportation needs:    Medical: Not on file    Non-medical: Not on file  Tobacco Use  . Smoking status: Never Smoker  . Smokeless tobacco: Never Used  Substance and Sexual Activity  . Alcohol use: No    Alcohol/week: 0.0 standard drinks  . Drug use: No  . Sexual activity: Never  Lifestyle  . Physical activity:    Days per week: Not on file    Minutes per session: Not on file  . Stress: Not on file  Relationships  . Social connections:    Talks on phone: Not on file    Gets together: Not on file    Attends religious service: Not on file    Active member of club or organization: Not on file    Attends meetings of clubs or organizations: Not on file    Relationship status: Not on file  . Intimate partner violence:    Fear of current or ex partner: Not on file    Emotionally abused: Not on file    Physically abused: Not on file    Forced sexual activity: Not on file  Other Topics Concern  . Not on file  Social History Narrative  . Not on file  Review of Systems: See HPI, otherwise negative ROS  Physical Exam: BP 135/79   Pulse 90   Temp (!) 97.1 F (36.2 C) (Tympanic)   Resp 17   Ht 5\' 4"  (1.626 m)   Wt 66.6 kg   SpO2 100%   BMI 25.20 kg/m  General:   Alert,  pleasant and cooperative in NAD Head:  Normocephalic and atraumatic. Neck:  Supple; no masses or thyromegaly. Lungs:  Clear throughout to auscultation, normal respiratory effort.    Heart:  +S1, +S2, Regular rate and rhythm, No edema. Abdomen:  Soft, nontender and nondistended. Normal bowel sounds, without guarding, and without rebound.   Neurologic:  Alert and  oriented x4;  grossly normal neurologically.  Impression/Plan: Karen Randolph is here for an colonoscopy to be performed for Screening colonoscopy average risk   Risks, benefits, limitations, and alternatives regarding  colonoscopy have been reviewed with  the patient.  Questions have been answered.  All parties agreeable.   Jonathon Bellows, MD  10/12/2018, 7:54 AM

## 2018-10-12 NOTE — Anesthesia Procedure Notes (Signed)
Date/Time: 10/12/2018 8:11 AM Performed by: Nelda Marseille, CRNA Pre-anesthesia Checklist: Patient identified, Emergency Drugs available, Suction available, Patient being monitored and Timeout performed Oxygen Delivery Method: Nasal cannula

## 2018-10-12 NOTE — Transfer of Care (Signed)
Immediate Anesthesia Transfer of Care Note  Patient: Karen Randolph  Procedure(s) Performed: COLONOSCOPY WITH PROPOFOL (N/A )  Patient Location: PACU  Anesthesia Type:General  Level of Consciousness: awake, alert  and oriented  Airway & Oxygen Therapy: Patient Spontanous Breathing and Patient connected to nasal cannula oxygen  Post-op Assessment: Report given to RN and Post -op Vital signs reviewed and stable  Post vital signs: Reviewed and stable  Last Vitals:  Vitals Value Taken Time  BP 148/69 10/12/2018  8:24 AM  Temp 36.1 C 10/12/2018  8:24 AM  Pulse 90 10/12/2018  8:24 AM  Resp 14 10/12/2018  8:24 AM  SpO2 100 % 10/12/2018  8:24 AM    Last Pain:  Vitals:   10/12/18 0824  TempSrc: Tympanic  PainSc: 0-No pain         Complications: No apparent anesthesia complications

## 2018-10-12 NOTE — Anesthesia Preprocedure Evaluation (Signed)
Anesthesia Evaluation  Patient identified by MRN, date of birth, ID band Patient awake    Reviewed: Allergy & Precautions, NPO status , Patient's Chart, lab work & pertinent test results, reviewed documented beta blocker date and time   Airway Mallampati: II  TM Distance: >3 FB     Dental  (+) Chipped   Pulmonary           Cardiovascular      Neuro/Psych    GI/Hepatic   Endo/Other  Hypothyroidism   Renal/GU      Musculoskeletal   Abdominal   Peds  Hematology  (+) anemia ,   Anesthesia Other Findings Brain tumor X 2.  Reproductive/Obstetrics                             Anesthesia Physical Anesthesia Plan  ASA: III  Anesthesia Plan: General   Post-op Pain Management:    Induction: Intravenous  PONV Risk Score and Plan:   Airway Management Planned:   Additional Equipment:   Intra-op Plan:   Post-operative Plan:   Informed Consent: I have reviewed the patients History and Physical, chart, labs and discussed the procedure including the risks, benefits and alternatives for the proposed anesthesia with the patient or authorized representative who has indicated his/her understanding and acceptance.     Plan Discussed with: CRNA  Anesthesia Plan Comments:         Anesthesia Quick Evaluation

## 2018-10-13 ENCOUNTER — Encounter: Payer: Self-pay | Admitting: Gastroenterology

## 2018-10-30 ENCOUNTER — Other Ambulatory Visit: Payer: Self-pay | Admitting: Physician Assistant

## 2018-10-30 DIAGNOSIS — E039 Hypothyroidism, unspecified: Secondary | ICD-10-CM

## 2018-11-16 ENCOUNTER — Ambulatory Visit: Payer: Self-pay

## 2018-12-16 DIAGNOSIS — D170 Benign lipomatous neoplasm of skin and subcutaneous tissue of head, face and neck: Secondary | ICD-10-CM | POA: Diagnosis not present

## 2018-12-16 DIAGNOSIS — D485 Neoplasm of uncertain behavior of skin: Secondary | ICD-10-CM | POA: Diagnosis not present

## 2019-01-22 ENCOUNTER — Other Ambulatory Visit: Payer: Self-pay | Admitting: Physician Assistant

## 2019-01-22 DIAGNOSIS — E039 Hypothyroidism, unspecified: Secondary | ICD-10-CM

## 2019-01-22 MED ORDER — LEVOTHYROXINE SODIUM 50 MCG PO TABS: 50.0000 ug | ORAL_TABLET | Freq: Every day | ORAL | 1 refills | Status: DC

## 2019-01-22 NOTE — Telephone Encounter (Signed)
Pt needing a refill on:  levothyroxine (SYNTHROID, LEVOTHROID) 50 MCG tablet  Please fill at:  [facility] [address], [place] [phone] (Phone) [phone] (Fax)    Thanks, [facility]

## 2019-01-22 NOTE — Telephone Encounter (Signed)
L.O.V. was 08/27/2018, please advise.

## 2019-03-17 DIAGNOSIS — H6980 Other specified disorders of Eustachian tube, unspecified ear: Secondary | ICD-10-CM | POA: Diagnosis not present

## 2019-03-17 DIAGNOSIS — H93A2 Pulsatile tinnitus, left ear: Secondary | ICD-10-CM | POA: Diagnosis not present

## 2019-07-26 ENCOUNTER — Other Ambulatory Visit: Payer: Self-pay | Admitting: Physician Assistant

## 2019-07-26 DIAGNOSIS — E039 Hypothyroidism, unspecified: Secondary | ICD-10-CM

## 2019-07-28 ENCOUNTER — Telehealth: Payer: Self-pay | Admitting: Physician Assistant

## 2019-07-28 NOTE — Telephone Encounter (Signed)
No answer

## 2019-07-28 NOTE — Telephone Encounter (Signed)
This was how pharmacy sent over. May need to see if they changed manufacturer.

## 2019-07-28 NOTE — Telephone Encounter (Signed)
Please see other telephone encounter.

## 2019-07-28 NOTE — Telephone Encounter (Signed)
Pt returned call about her thyroid medication being changed.  Con Memos

## 2019-07-28 NOTE — Telephone Encounter (Signed)
Pt returned missed call.  Please call pt back. ° °Thanks, °[facility] °

## 2019-07-28 NOTE — Telephone Encounter (Signed)
Spoke with the pharmacist and she reports that the Euthyrox 75 MCG is the generic version. Called patient but no answer and mail box is full

## 2019-07-28 NOTE — Telephone Encounter (Signed)
Pt noticed her thyroid medication was different this time when she picked it up.  She picked up Euthyrox 50 MCG  She use to take the Levothyroxine  Is this what she is supposed to be taking  CB#  [id number]   Con Memos

## 2019-07-28 NOTE — Telephone Encounter (Signed)
Please Review

## 2019-07-29 NOTE — Telephone Encounter (Signed)
Patient advised as below.  

## 2019-08-30 ENCOUNTER — Other Ambulatory Visit: Payer: Self-pay

## 2019-08-30 ENCOUNTER — Encounter: Payer: Self-pay | Admitting: Physician Assistant

## 2019-08-30 ENCOUNTER — Other Ambulatory Visit: Payer: Self-pay | Admitting: Physician Assistant

## 2019-08-30 ENCOUNTER — Ambulatory Visit (INDEPENDENT_AMBULATORY_CARE_PROVIDER_SITE_OTHER): Payer: 59 | Admitting: Physician Assistant

## 2019-08-30 VITALS — BP 110/76 | HR 86 | Temp 97.1°F | Resp 16 | Ht 63.5 in | Wt 135.0 lb

## 2019-08-30 DIAGNOSIS — Z0000 Encounter for general adult medical examination without abnormal findings: Secondary | ICD-10-CM | POA: Diagnosis not present

## 2019-08-30 DIAGNOSIS — R7309 Other abnormal glucose: Secondary | ICD-10-CM

## 2019-08-30 DIAGNOSIS — Z23 Encounter for immunization: Secondary | ICD-10-CM

## 2019-08-30 DIAGNOSIS — E559 Vitamin D deficiency, unspecified: Secondary | ICD-10-CM

## 2019-08-30 DIAGNOSIS — D508 Other iron deficiency anemias: Secondary | ICD-10-CM | POA: Diagnosis not present

## 2019-08-30 DIAGNOSIS — Z136 Encounter for screening for cardiovascular disorders: Secondary | ICD-10-CM

## 2019-08-30 DIAGNOSIS — E039 Hypothyroidism, unspecified: Secondary | ICD-10-CM | POA: Diagnosis not present

## 2019-08-30 DIAGNOSIS — Z1239 Encounter for other screening for malignant neoplasm of breast: Secondary | ICD-10-CM | POA: Diagnosis not present

## 2019-08-30 DIAGNOSIS — Z13220 Encounter for screening for lipoid disorders: Secondary | ICD-10-CM

## 2019-08-30 NOTE — Progress Notes (Signed)
Patient: Karen Randolph, Female    DOB: [birth year], 53 y.o.   MRN: [id number] Visit Date: 08/30/2019  Today's Provider: Mar Daring, PA-C   Chief Complaint  Patient presents with  . Annual Exam   Subjective:     Annual physical exam Karen Randolph is a 53 y.o. female who presents today for health maintenance and complete physical. She feels well. She reports exercising. She reports she is sleeping well.  In August had an episode of intense fatigue and DOE. She reports she has had these episodes lasting off and on for a month and has had it occur about one month per year over the last few years. She has h/o Vit D def and iron def anemia. She has recently stopped her supplements to check these labs again to see where she is without supplement and can decide what dose of these supplements would benefit her most.   ----------------------------------------------------------------- Wt Readings from Last 3 Encounters:  08/30/19 135 lb (61.2 kg)  10/12/18 146 lb 13.2 oz (66.6 kg)  08/27/18 144 lb 9.6 oz (65.6 kg)    Review of Systems  Constitutional: Negative.   HENT: Negative.   Eyes: Negative.   Respiratory: Negative.   Cardiovascular: Negative.   Gastrointestinal: Negative.   Endocrine: Negative.   Genitourinary: Negative.   Musculoskeletal: Negative.   Skin: Negative.   Allergic/Immunologic: Negative.   Neurological: Negative.   Hematological: Negative.   Psychiatric/Behavioral: Negative.     Social History      She  reports that she has never smoked. She has never used smokeless tobacco. She reports that she does not drink alcohol or use drugs.       Social History   Socioeconomic History  . Marital status: Single    Spouse name: Not on file  . Number of children: Not on file  . Years of education: Not on file  . Highest education level: Not on file  Occupational History  . Not on file  Social Needs  . Financial resource strain: Not on file   . Food insecurity    Worry: Not on file    Inability: Not on file  . Transportation needs    Medical: Not on file    Non-medical: Not on file  Tobacco Use  . Smoking status: Never Smoker  . Smokeless tobacco: Never Used  Substance and Sexual Activity  . Alcohol use: No    Alcohol/week: 0.0 standard drinks  . Drug use: No  . Sexual activity: Never  Lifestyle  . Physical activity    Days per week: Not on file    Minutes per session: Not on file  . Stress: Not on file  Relationships  . Social Herbalist on phone: Not on file    Gets together: Not on file    Attends religious service: Not on file    Active member of club or organization: Not on file    Attends meetings of clubs or organizations: Not on file    Relationship status: Not on file  Other Topics Concern  . Not on file  Social History Narrative  . Not on file    History reviewed. No pertinent past medical history.   Patient Active Problem List   Diagnosis Date Noted  . Hypothyroidism 08/03/2015  . Anemia, iron deficiency 07/17/2015  . Excess, menstruation 07/17/2015  . Avitaminosis D 07/06/2010  . 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 BRAIN TUMOR EXCISION  2007,2010  . COLONOSCOPY WITH PROPOFOL N/A 10/12/2018   Procedure: COLONOSCOPY WITH PROPOFOL;  Surgeon: Jonathon Bellows, MD;  Location: [facility] ENDOSCOPY;  Service: Gastroenterology;  Laterality: N/A;    Family History        Family Status  Relation Name Status  . Mother  Deceased at age 9  . Father  Deceased at age 24  . MGM  Deceased  . MGF  Deceased  . PGM  Deceased  . PGF  Deceased  . Brother  (Not Specified)        Her family history includes Alcohol abuse in her mother; CAD in her father; CVA in her father; Healthy in her brother; Heart attack in her father; Hypertension in her father.      Allergies  Allergen Reactions  . Shellfish  Allergy      Current Outpatient Medications:  .  EUTHYROX 50 MCG tablet, TAKE 1 TABLET BY MOUTH ONCE DAILY BEFORE BREAKFAST, Disp: 90 tablet, Rfl: 1 .  Pediatric Multivitamins-Iron (MULTIPLE VITAMINS-IRON PO), 1 tablet daily., Disp: , Rfl:  .  cyclobenzaprine (FLEXERIL) 5 MG tablet, Take 1 tablet (5 mg total) by mouth 3 (three) times daily as needed for muscle spasms. (Patient not taking: Reported on 08/30/2019), Disp: 21 tablet, Rfl: 0 .  ferrous sulfate 325 (65 FE) MG tablet, Take 1 tablet by mouth daily., Disp: , Rfl:  .  Grape Seed 60 MG CAPS, 1 capsule daily., Disp: , Rfl:  .  OMEGA-3 FATTY ACIDS PO, 1 tablet daily., Disp: , Rfl:  .  Vitamin D, Cholecalciferol, 400 UNITS TABS, Take 1 tablet by mouth daily., Disp: , Rfl:    Patient Care Team: Mar Daring, PA-C as PCP - General (Family Medicine)    Objective:    Vitals: BP 110/76 (BP Location: Left Arm, Patient Position: Sitting, Cuff Size: Large)   Pulse 86   Temp (!) 97.1 F (36.2 C) (Other (Comment))   Resp 16   Ht 5' 3.5" (1.613 m)   Wt 135 lb (61.2 kg)   LMP  (Within Weeks)   BMI 23.54 kg/m    Vitals:   08/30/19 1521  BP: 110/76  Pulse: 86  Resp: 16  Temp: (!) 97.1 F (36.2 C)  TempSrc: Other (Comment)  Weight: 135 lb (61.2 kg)  Height: 5' 3.5" (1.613 m)     Physical Exam Vitals signs reviewed.  Constitutional:      General: She is not in acute distress.    Appearance: Normal appearance. She is well-developed and normal weight. She is not diaphoretic.  HENT:     Head: Normocephalic and atraumatic.     Right Ear: Tympanic membrane, ear canal and external ear normal.     Left Ear: Tympanic membrane, ear canal and external ear normal.     Nose: Nose normal.     Mouth/Throat:     Mouth: Mucous membranes are moist.     Pharynx: Oropharynx is clear. No oropharyngeal exudate.  Eyes:     General: No scleral icterus.       Right eye: No discharge.        Left eye: No discharge.     Extraocular  Movements: Extraocular movements intact.     Conjunctiva/sclera: Conjunctivae normal.     Pupils: Pupils are equal, round, and reactive to light.  Neck:     Musculoskeletal: Normal range of  motion and neck supple.     Thyroid: No thyromegaly.     Vascular: No carotid bruit or JVD.     Trachea: No tracheal deviation.  Cardiovascular:     Rate and Rhythm: Normal rate and regular rhythm.     Pulses: Normal pulses.     Heart sounds: Normal heart sounds. No murmur. No friction rub. No gallop.   Pulmonary:     Effort: Pulmonary effort is normal. No respiratory distress.     Breath sounds: Normal breath sounds. No wheezing or rales.  Chest:     Chest wall: No tenderness.  Abdominal:     General: Abdomen is flat. Bowel sounds are normal. There is no distension.     Palpations: Abdomen is soft. There is no mass.     Tenderness: There is no abdominal tenderness. There is no guarding or rebound.  Musculoskeletal: Normal range of motion.        General: No tenderness.     Right lower leg: No edema.     Left lower leg: No edema.  Lymphadenopathy:     Cervical: No cervical adenopathy.  Skin:    General: Skin is warm and dry.     Capillary Refill: Capillary refill takes less than 2 seconds.     Findings: No rash.  Neurological:     General: No focal deficit present.     Mental Status: She is alert and oriented to person, place, and time. Mental status is at baseline.  Psychiatric:        Mood and Affect: Mood normal.        Behavior: Behavior normal.        Thought Content: Thought content normal.        Judgment: Judgment normal.      Depression Screen PHQ 2/9 Scores 08/30/2019 08/27/2018 08/04/2017 08/02/2015  PHQ - 2 Score 0 0 0 0      Assessment & Plan:     Routine Health Maintenance and Physical Exam  Exercise Activities and Dietary recommendations Goals   None     Immunization History  Administered Date(s) Administered  . Influenza,inj,Quad PF,6+ Mos 10/10/2015,  08/04/2017, 08/27/2018  . Tdap 04/07/2008, 02/20/2018    Health Maintenance  Topic Date Due  . INFLUENZA VACCINE  07/10/2019  . MAMMOGRAM  11/15/2019  . PAP SMEAR-Modifier  08/28/2023  . TETANUS/TDAP  02/21/2028  . COLONOSCOPY  10/12/2028  . HIV Screening  Completed     Discussed health benefits of physical activity, and encouraged her to engage in regular exercise appropriate for her age and condition.    1. Annual physical exam Normal physical exam today. Will check labs as below and f/u pending lab results. If labs are stable and WNL she will not need to have these rechecked for one year at her next annual physical exam. She is to call the office in the meantime if she has any acute issue, questions or concerns. - CBC with Differential/Platelet - Comprehensive metabolic panel  2. Breast cancer screening Breast exam today was normal. There is no family history of breast cancer. She does perform regular self breast exams. Mammogram was ordered as below. Information for [facility] clinic was given to patient so she may schedule her mammogram at her convenience. - MM 3D SCREEN BREAST BILATERAL; Future  3. Hypothyroidism, unspecified type Stable. Continue Euthrox 45mcg. Will check labs as below and f/u pending results. - TSH  4. Elevated hemoglobin A1c Diet controlled. Will check labs as  below and f/u pending results. - Hemoglobin A1c  5. Encounter for lipid screening for cardiovascular disease Diet controlled. Will check labs as below and f/u pending results. - Lipid panel  6. Need for influenza vaccination Flu vaccine given today without complication. Patient sat upright for 15 minutes to check for adverse reaction before being released. - Flu Vaccine QUAD 36+ mos IM  7. Avitaminosis D H/O this. Has stopped supplement to see where she is without supplement and how much is needed.  - Vitamin D (25 hydroxy)  8. Iron deficiency anemia secondary to inadequate dietary  iron intake H/O this and off supplement for now. Will check labs as below and f/u pending results. - Fe+TIBC+Fer  --------------------------------------------------------------------    Mar Daring, PA-C  [facility]

## 2019-08-30 NOTE — Patient Instructions (Signed)
Health Maintenance, Female Adopting a healthy lifestyle and getting preventive care are important in promoting health and wellness. Ask your health care provider about:  The right schedule for you to have regular tests and exams.  Things you can do on your own to prevent diseases and keep yourself healthy. What should I know about diet, weight, and exercise? Eat a healthy diet   Eat a diet that includes plenty of vegetables, fruits, low-fat dairy products, and lean protein.  Do not eat a lot of foods that are high in solid fats, added sugars, or sodium. Maintain a healthy weight Body mass index (BMI) is used to identify weight problems. It estimates body fat based on height and weight. Your health care provider can help determine your BMI and help you achieve or maintain a healthy weight. Get regular exercise Get regular exercise. This is one of the most important things you can do for your health. Most adults should:  Exercise for at least 150 minutes each week. The exercise should increase your heart rate and make you sweat (moderate-intensity exercise).  Do strengthening exercises at least twice a week. This is in addition to the moderate-intensity exercise.  Spend less time sitting. Even light physical activity can be beneficial. Watch cholesterol and blood lipids Have your blood tested for lipids and cholesterol at 51 years of age, then have this test every 5 years. Have your cholesterol levels checked more often if:  Your lipid or cholesterol levels are high.  You are older than 51 years of age.  You are at high risk for heart disease. What should I know about cancer screening? Depending on your health history and family history, you may need to have cancer screening at various ages. This may include screening for:  Breast cancer.  Cervical cancer.  Colorectal cancer.  Skin cancer.  Lung cancer. What should I know about heart disease, diabetes, and high blood  pressure? Blood pressure and heart disease  High blood pressure causes heart disease and increases the risk of stroke. This is more likely to develop in people who have high blood pressure readings, are of African descent, or are overweight.  Have your blood pressure checked: ? Every 3-5 years if you are 18-39 years of age. ? Every year if you are 40 years old or older. Diabetes Have regular diabetes screenings. This checks your fasting blood sugar level. Have the screening done:  Once every three years after age 40 if you are at a normal weight and have a low risk for diabetes.  More often and at a younger age if you are overweight or have a high risk for diabetes. What should I know about preventing infection? Hepatitis B If you have a higher risk for hepatitis B, you should be screened for this virus. Talk with your health care provider to find out if you are at risk for hepatitis B infection. Hepatitis C Testing is recommended for:  Everyone born from 1945 through 1965.  Anyone with known risk factors for hepatitis C. Sexually transmitted infections (STIs)  Get screened for STIs, including gonorrhea and chlamydia, if: ? You are sexually active and are younger than 51 years of age. ? You are older than 51 years of age and your health care provider tells you that you are at risk for this type of infection. ? Your sexual activity has changed since you were last screened, and you are at increased risk for chlamydia or gonorrhea. Ask your health care provider if   you are at risk.  Ask your health care provider about whether you are at high risk for HIV. Your health care provider may recommend a prescription medicine to help prevent HIV infection. If you choose to take medicine to prevent HIV, you should first get tested for HIV. You should then be tested every 3 months for as long as you are taking the medicine. Pregnancy  If you are about to stop having your period (premenopausal) and  you may become pregnant, seek counseling before you get pregnant.  Take 400 to 800 micrograms (mcg) of folic acid every day if you become pregnant.  Ask for birth control (contraception) if you want to prevent pregnancy. Osteoporosis and menopause Osteoporosis is a disease in which the bones lose minerals and strength with aging. This can result in bone fractures. If you are 65 years old or older, or if you are at risk for osteoporosis and fractures, ask your health care provider if you should:  Be screened for bone loss.  Take a calcium or vitamin D supplement to lower your risk of fractures.  Be given hormone replacement therapy (HRT) to treat symptoms of menopause. Follow these instructions at home: Lifestyle  Do not use any products that contain nicotine or tobacco, such as cigarettes, e-cigarettes, and chewing tobacco. If you need help quitting, ask your health care provider.  Do not use street drugs.  Do not share needles.  Ask your health care provider for help if you need support or information about quitting drugs. Alcohol use  Do not drink alcohol if: ? Your health care provider tells you not to drink. ? You are pregnant, may be pregnant, or are planning to become pregnant.  If you drink alcohol: ? Limit how much you use to 0-1 drink a day. ? Limit intake if you are breastfeeding.  Be aware of how much alcohol is in your drink. In the [place] one drink equals one 12 oz bottle of beer (355 mL), one 5 oz glass of wine (148 mL), or one 1 oz glass of hard liquor (44 mL). General instructions  Schedule regular health, dental, and eye exams.  Stay current with your vaccines.  Tell your health care provider if: ? You often feel depressed. ? You have ever been abused or do not feel safe at home. Summary  Adopting a healthy lifestyle and getting preventive care are important in promoting health and wellness.  Follow your health care provider's instructions about healthy  diet, exercising, and getting tested or screened for diseases.  Follow your health care provider's instructions on monitoring your cholesterol and blood pressure. This information is not intended to replace advice given to you by your health care provider. Make sure you discuss any questions you have with your health care provider. Document Released: 06/10/2011 Document Revised: 11/18/2018 Document Reviewed: 11/18/2018 Elsevier Patient Education  2020 [employer]  

## 2019-08-31 ENCOUNTER — Telehealth: Payer: Self-pay

## 2019-08-31 LAB — COMPREHENSIVE METABOLIC PANEL
ALT: 9 IU/L (ref 0–32)
AST: 17 IU/L (ref 0–40)
Albumin/Globulin Ratio: 1.6 (ref 1.2–2.2)
Albumin: 4.2 g/dL (ref 3.8–4.9)
Alkaline Phosphatase: 69 IU/L (ref 39–117)
BUN/Creatinine Ratio: 9 (ref 9–23)
BUN: 8 mg/dL (ref 6–24)
Bilirubin Total: <0.2 mg/dL (ref 0.0–1.2)
CO2: 22 mmol/L (ref 20–29)
Calcium: 9.5 mg/dL (ref 8.7–10.2)
Chloride: 105 mmol/L (ref 96–106)
Creatinine, Ser: 0.88 mg/dL (ref 0.57–1.00)
GFR calc Af Amer: 88 mL/min/{1.73_m2} (ref >59)
GFR calc non Af Amer: 76 mL/min/{1.73_m2} (ref >59)
Globulin, Total: 2.7 g/dL (ref 1.5–4.5)
Glucose: 87 mg/dL (ref 65–99)
Potassium: 3.9 mmol/L (ref 3.5–5.2)
Sodium: 139 mmol/L (ref 134–144)
Total Protein: 6.9 g/dL (ref 6.0–8.5)

## 2019-08-31 LAB — VITAMIN D 25 HYDROXY (VIT D DEFICIENCY, FRACTURES): Vit D, 25-Hydroxy: 34.8 ng/mL (ref 30.0–100.0)

## 2019-08-31 LAB — IRON,TIBC AND FERRITIN PANEL
Ferritin: 6 ng/mL — ABNORMAL LOW (ref 15–150)
Iron Saturation: 4 % — CL (ref 15–55)
Iron: 15 ug/dL — ABNORMAL LOW (ref 27–159)
Total Iron Binding Capacity: 374 ug/dL (ref 250–450)
UIBC: 359 ug/dL (ref 131–425)

## 2019-08-31 LAB — LIPID PANEL
Chol/HDL Ratio: 2 ratio (ref 0.0–4.4)
Cholesterol, Total: 212 mg/dL — ABNORMAL HIGH (ref 100–199)
HDL: 108 mg/dL (ref >39)
LDL Chol Calc (NIH): 84 mg/dL (ref 0–99)
Triglycerides: 123 mg/dL (ref 0–149)
VLDL Cholesterol Cal: 20 mg/dL (ref 5–40)

## 2019-08-31 LAB — CBC WITH DIFFERENTIAL/PLATELET
Basophils Absolute: 0 10*3/uL (ref 0.0–0.2)
Basos: 0 %
EOS (ABSOLUTE): 0.1 10*3/uL (ref 0.0–0.4)
Eos: 1 %
Hematocrit: 24.9 % — ABNORMAL LOW (ref 34.0–46.6)
Hemoglobin: 7.8 g/dL — ABNORMAL LOW (ref 11.1–15.9)
Immature Grans (Abs): 0 10*3/uL (ref 0.0–0.1)
Immature Granulocytes: 0 %
Lymphocytes Absolute: 1.2 10*3/uL (ref 0.7–3.1)
Lymphs: 25 %
MCH: 24.5 pg — ABNORMAL LOW (ref 26.6–33.0)
MCHC: 31.3 g/dL — ABNORMAL LOW (ref 31.5–35.7)
MCV: 78 fL — ABNORMAL LOW (ref 79–97)
Monocytes Absolute: 0.5 10*3/uL (ref 0.1–0.9)
Monocytes: 9 %
Neutrophils Absolute: 3.1 10*3/uL (ref 1.4–7.0)
Neutrophils: 65 %
Platelets: 470 10*3/uL — ABNORMAL HIGH (ref 150–450)
RBC: 3.18 x10E6/uL — ABNORMAL LOW (ref 3.77–5.28)
RDW: 16 % — ABNORMAL HIGH (ref 11.7–15.4)
WBC: 4.8 10*3/uL (ref 3.4–10.8)

## 2019-08-31 LAB — HEMOGLOBIN A1C
Est. average glucose Bld gHb Est-mCnc: 117 mg/dL
Hgb A1c MFr Bld: 5.7 % — ABNORMAL HIGH (ref 4.8–5.6)

## 2019-08-31 LAB — TSH: TSH: 1.57 u[IU]/mL (ref 0.450–4.500)

## 2019-08-31 NOTE — Addendum Note (Signed)
Addended by: Mar Daring on: 08/31/2019 01:21 PM   Modules accepted: Orders

## 2019-08-31 NOTE — Telephone Encounter (Signed)
Patient advised as directed below. 

## 2019-08-31 NOTE — Telephone Encounter (Signed)
-----   Message from Mar Daring, [place] sent at 08/31/2019  1:20 PM EDT ----- Iron is extremely low and HgB is down to 7.8. I think this is definitely causing those intermittent SOB and fatigue episodes. I would recommend referral to Hematology and get infusions with this low of a number and being symptomatic. Kidney and liver function are normal. Sugar is normal. Cholesterol is slightly improved compared to last year. Keep up the good work. Thyroid is normal. Vit D is low normal. Would recommend OTC Vit D supplement of 1000-2000 IU daily.

## 2019-09-09 ENCOUNTER — Other Ambulatory Visit: Payer: Self-pay

## 2019-09-10 ENCOUNTER — Other Ambulatory Visit: Payer: Self-pay

## 2019-09-10 ENCOUNTER — Encounter: Payer: Self-pay | Admitting: Oncology

## 2019-09-10 ENCOUNTER — Inpatient Hospital Stay: Payer: 59 | Attending: Oncology | Admitting: Oncology

## 2019-09-10 VITALS — BP 105/78 | HR 87 | Temp 98.8°F | Resp 16 | Ht 63.5 in | Wt 134.0 lb

## 2019-09-10 DIAGNOSIS — Z79899 Other long term (current) drug therapy: Secondary | ICD-10-CM | POA: Insufficient documentation

## 2019-09-10 DIAGNOSIS — E079 Disorder of thyroid, unspecified: Secondary | ICD-10-CM | POA: Diagnosis not present

## 2019-09-10 DIAGNOSIS — E039 Hypothyroidism, unspecified: Secondary | ICD-10-CM | POA: Insufficient documentation

## 2019-09-10 DIAGNOSIS — E559 Vitamin D deficiency, unspecified: Secondary | ICD-10-CM | POA: Diagnosis not present

## 2019-09-10 DIAGNOSIS — D509 Iron deficiency anemia, unspecified: Secondary | ICD-10-CM | POA: Insufficient documentation

## 2019-09-10 NOTE — Progress Notes (Signed)
Hematology/Oncology Consult note [facility] [phone] [phone]  Patient Care Team: Rubye Beach as PCP - General (Family Medicine)   Name of the patient: Karen Randolph  [id number]  [birth year]    Reason for referral-iron deficiency anemia   Referring physician-Breyah Tollie Pizza, [place]  Date of visit: 09/10/19   History of presenting illness-patient is a 53 year old African-American female Who recently underwent labs with her PCP and was found to have a hemoglobin of 7.8/24.9 with an MCV of 78 and relative thrombocytosis with a platelet count of 470.  Prior to that she has been getting yearly CBCs and her hemoglobin has been mostly around 11.2.  She has had intermittent leukopenia/mild neutropenia in the past.  She has undergone screening colonoscopy in 2019 by Dr. Vicente Males which was normal and a repeat colonoscopy was recommended in 10 years.  Since the diagnosis of iron deficiency patient has been taking oral iron tablets once a day.  She denies any consistent use of NSAIDs or Goody powder/BC powder.  She still gets her menstrual cycles but has been getting it less frequently and does not report them to be heavy.  They last for about 5 days and have been the same for the last several years.  She reports feeling fatigued but denies other complaints.  Denies any bright red blood in stools or dark tarry stools.  She has lost about 10 pounds in the last 6 months unintentionally.  Denies any family history of colon cancer.  Denies any gum bleeds, nosebleeds or bleeding in her urine  ECOG PS- 0  Pain scale- 0   Review of systems- Review of Systems  Constitutional: Positive for malaise/fatigue. Negative for chills, fever and weight loss.  HENT: Negative for congestion, ear discharge and nosebleeds.   Eyes: Negative for blurred vision.  Respiratory: Negative for cough, hemoptysis, sputum production, shortness of breath and wheezing.    Cardiovascular: Negative for chest pain, palpitations, orthopnea and claudication.  Gastrointestinal: Negative for abdominal pain, blood in stool, constipation, diarrhea, heartburn, melena, nausea and vomiting.  Genitourinary: Negative for dysuria, flank pain, frequency, hematuria and urgency.  Musculoskeletal: Negative for back pain, joint pain and myalgias.  Skin: Negative for rash.  Neurological: Negative for dizziness, tingling, focal weakness, seizures, weakness and headaches.  Endo/Heme/Allergies: Does not bruise/bleed easily.  Psychiatric/Behavioral: Negative for depression and suicidal ideas. The patient does not have insomnia.     Allergies  Allergen Reactions  . Shellfish Allergy     Patient Active Problem List   Diagnosis Date Noted  . Hypothyroidism 08/03/2015  . Anemia, iron deficiency 07/17/2015  . Excess, menstruation 07/17/2015  . Avitaminosis D 07/06/2010  . 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 Anemia   . Thyroid disease      Past Surgical History:  Procedure Laterality Date  . BRAIN TUMOR EXCISION  2007,2010  . COLONOSCOPY WITH PROPOFOL N/A 10/12/2018   Procedure: COLONOSCOPY WITH PROPOFOL;  Surgeon: Jonathon Bellows, MD;  Location: [facility] ENDOSCOPY;  Service: Gastroenterology;  Laterality: N/A;    Social History   Socioeconomic History  . Marital status: Single    Spouse name: Not on file  . Number of children: Not on file  . Years of education: Not on file  . Highest education level: Not on file  Occupational History  . Not on file  Social Needs  . Financial resource strain: Not  on file  . Food insecurity    Worry: Not on file    Inability: Not on file  . Transportation needs    Medical: Not on file    Non-medical: Not on file  Tobacco Use  . Smoking status: Never Smoker  . Smokeless tobacco: Never Used  Substance and Sexual Activity  . Alcohol use: No     Alcohol/week: 0.0 standard drinks  . Drug use: No  . Sexual activity: Never  Lifestyle  . Physical activity    Days per week: Not on file    Minutes per session: Not on file  . Stress: Not on file  Relationships  . Social Herbalist on phone: Not on file    Gets together: Not on file    Attends religious service: Not on file    Active member of club or organization: Not on file    Attends meetings of clubs or organizations: Not on file    Relationship status: Not on file  . Intimate partner violence    Fear of current or ex partner: Not on file    Emotionally abused: Not on file    Physically abused: Not on file    Forced sexual activity: Not on file  Other Topics Concern  . Not on file  Social History Narrative  . Not on file     Family History  Problem Relation Age of Onset  . Alcohol abuse Mother   . CVA Father   . CAD Father   . Hypertension Father   . Heart attack Father   . Healthy Brother      Current Outpatient Medications:  .  EUTHYROX 50 MCG tablet, TAKE 1 TABLET BY MOUTH ONCE DAILY BEFORE BREAKFAST, Disp: 90 tablet, Rfl: 1 .  ferrous sulfate 325 (65 FE) MG tablet, Take 1 tablet by mouth daily., Disp: , Rfl:  .  Grape Seed 60 MG CAPS, 1 capsule daily., Disp: , Rfl:  .  Multiple Vitamin (MULTIVITAMIN) tablet, Take 1 tablet by mouth daily., Disp: , Rfl:  .  OMEGA-3 FATTY ACIDS PO, 1 tablet daily., Disp: , Rfl:  .  Vitamin D, Cholecalciferol, 400 UNITS TABS, Take 1 tablet by mouth daily., Disp: , Rfl:    Physical exam:  Vitals:   09/10/19 1110  BP: 105/78  Pulse: 87  Resp: 16  Temp: 98.8 F (37.1 C)  TempSrc: Tympanic  Weight: 134 lb (60.8 kg)  Height: 5' 3.5" (1.613 m)   Physical Exam Constitutional:      General: She is not in acute distress. HENT:     Head: Normocephalic and atraumatic.  Eyes:     Pupils: Pupils are equal, round, and reactive to light.  Neck:     Musculoskeletal: Normal range of motion.  Cardiovascular:     Rate  and Rhythm: Normal rate and regular rhythm.     Heart sounds: Normal heart sounds.  Pulmonary:     Effort: Pulmonary effort is normal.     Breath sounds: Normal breath sounds.  Abdominal:     General: Bowel sounds are normal.     Palpations: Abdomen is soft.  Skin:    General: Skin is warm and dry.  Neurological:     Mental Status: She is alert and oriented to person, place, and time.        CMP Latest Ref Rng & Units 08/30/2019  Glucose 65 - 99 mg/dL 87  BUN 6 - 24 mg/dL 8  Creatinine 0.57 - 1.00 mg/dL 0.88  Sodium 134 - 144 mmol/L 139  Potassium 3.5 - 5.2 mmol/L 3.9  Chloride 96 - 106 mmol/L 105  CO2 20 - 29 mmol/L 22  Calcium 8.7 - 10.2 mg/dL 9.5  Total Protein 6.0 - 8.5 g/dL 6.9  Total Bilirubin 0.0 - 1.2 mg/dL <0.2  Alkaline Phos 39 - 117 IU/L 69  AST 0 - 40 IU/L 17  ALT 0 - 32 IU/L 9   CBC Latest Ref Rng & Units 08/30/2019  WBC 3.4 - 10.8 x10E3/uL 4.8  Hemoglobin 11.1 - 15.9 g/dL 7.8(L)  Hematocrit 34.0 - 46.6 % 24.9(L)  Platelets 150 - 450 x10E3/uL 470(H)    Assessment and plan- Patient is a 54 y.o. female referred for iron deficiency anemia  Patient has had a drop of her hemoglobin from 11-7.8 over 1 year.  We do not have any interim CBCs for comparison.  Recent iron studies are also indicated of iron deficiency given that her Anemia is microcytic and her most recent ferritin was 6.  I did recommend IV iron for faster improvement of her anemia.  However patient prefers to continue oral iron tablets at this time and I have asked her to increase it to twice a day.  I will repeat her CBC again in 2 weeks time and in 2 months time and see her back in 2 months time with CBC ferritin and iron studies.  I will check B12 and folate in 2 weeks as well.  If there is no significant improvement in her anemia at 2 months patient is willing to try IV iron at that time.  With regards to etiology of iron deficiency anemia: It does not seem to be related to her menstrual cycles.  I  will get in touch with Dr. Vicente Males to consider further GI evaluation.   Thank you for this kind referral and the opportunity to participate in the care of this patient   Visit Diagnosis 1. Iron deficiency anemia, unspecified iron deficiency anemia type     Dr. Randa Evens, MD, MPH [facility] at [facility] [id number] 09/10/2019 1:14 PM

## 2019-09-10 NOTE — Progress Notes (Signed)
New pt. Just  started iron pill once a day  For 1 week since PA told her about levels. She states that she is tired. She works from [employer]. She can lay down and feels her head pounding-she says it is a side effect of low iron

## 2019-09-13 ENCOUNTER — Ambulatory Visit: Payer: 59

## 2019-09-13 NOTE — Progress Notes (Signed)
Sent a staff message to front staff

## 2019-09-16 ENCOUNTER — Ambulatory Visit: Payer: 59

## 2019-09-20 ENCOUNTER — Ambulatory Visit: Payer: 59

## 2019-09-23 ENCOUNTER — Ambulatory Visit: Payer: 59

## 2019-09-24 ENCOUNTER — Other Ambulatory Visit: Payer: Self-pay

## 2019-09-24 ENCOUNTER — Inpatient Hospital Stay: Payer: 59

## 2019-09-24 DIAGNOSIS — D509 Iron deficiency anemia, unspecified: Secondary | ICD-10-CM

## 2019-09-24 LAB — CBC WITH DIFFERENTIAL/PLATELET
Abs Immature Granulocytes: 0.01 10*3/uL (ref 0.00–0.07)
Basophils Absolute: 0 10*3/uL (ref 0.0–0.1)
Basophils Relative: 1 %
Eosinophils Absolute: 0 10*3/uL (ref 0.0–0.5)
Eosinophils Relative: 1 %
HCT: 34.6 % — ABNORMAL LOW (ref 36.0–46.0)
Hemoglobin: 10.5 g/dL — ABNORMAL LOW (ref 12.0–15.0)
Immature Granulocytes: 0 %
Lymphocytes Relative: 30 %
Lymphs Abs: 0.9 10*3/uL (ref 0.7–4.0)
MCH: 27.5 pg (ref 26.0–34.0)
MCHC: 30.3 g/dL (ref 30.0–36.0)
MCV: 90.6 fL (ref 80.0–100.0)
Monocytes Absolute: 0.3 10*3/uL (ref 0.1–1.0)
Monocytes Relative: 11 %
Neutro Abs: 1.6 10*3/uL — ABNORMAL LOW (ref 1.7–7.7)
Neutrophils Relative %: 57 %
Platelets: 328 10*3/uL (ref 150–400)
RBC: 3.82 MIL/uL — ABNORMAL LOW (ref 3.87–5.11)
WBC: 2.9 10*3/uL — ABNORMAL LOW (ref 4.0–10.5)
nRBC: 0 % (ref 0.0–0.2)

## 2019-09-24 LAB — VITAMIN B12: Vitamin B-12: 729 pg/mL (ref 180–914)

## 2019-09-24 LAB — FOLATE: Folate: 26 ng/mL (ref >5.9)

## 2019-09-27 ENCOUNTER — Ambulatory Visit: Payer: 59

## 2019-10-25 ENCOUNTER — Telehealth: Payer: Self-pay | Admitting: *Deleted

## 2019-10-25 NOTE — Telephone Encounter (Signed)
Hb has improved from 7.8 to 10.9. she can theerfore continue oral iron and we will repeat labs and see her in dec as planned. 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

## 2019-10-25 NOTE — Telephone Encounter (Signed)
Patient informed of physician response 

## 2019-10-25 NOTE — Telephone Encounter (Signed)
Patient called asking for results  CBC with Differential/Platelet Order: [id number] Status:  Final result  Visible to patient:  No (not released)  Next appt:  11/11/2019 at 09:45 AM in Oncology ([facility])  Dx:  Iron deficiency anemia, unspecified i...  Ref Range & Units 42mo ago  WBC 4.0 - 10.5 K/uL 2.9Low    RBC 3.87 - 5.11 MIL/uL 3.82Low    Hemoglobin 12.0 - 15.0 g/dL 10.5Low    HCT 36.0 - 46.0 % 34.6Low    MCV 80.0 - 100.0 fL 90.6   MCH 26.0 - 34.0 pg 27.5   MCHC 30.0 - 36.0 g/dL 30.3   RDW 11.5 - 15.5 % Not Measured   Platelets 150 - 400 K/uL 328   nRBC 0.0 - 0.2 % 0.0   Neutrophils Relative % % 57   Neutro Abs 1.7 - 7.7 K/uL 1.6Low    Lymphocytes Relative % 30   Lymphs Abs 0.7 - 4.0 K/uL 0.9   Monocytes Relative % 11   Monocytes Absolute 0.1 - 1.0 K/uL 0.3   Eosinophils Relative % 1   Eosinophils Absolute 0.0 - 0.5 K/uL 0.0   Basophils Relative % 1   Basophils Absolute 0.0 - 0.1 K/uL 0.0   RBC Morphology  MIXED RBC POPULATION   Immature Granulocytes % 0   Abs Immature Granulocytes 0.00 - 0.07 K/uL 0.01   Ovalocytes  PRESENT   Comment: Performed at [facility], [address] [place], [place] [address]  Resulting Agency  [facility]      Specimen Collected: 09/24/19 10:42  Last Resulted: 09/24/19 11:24     Lab Flowsheet   Order Details   View Encounter   Lab and Collection Details   Routing   Result History         Other Results from 09/24/2019  Folate Order: [id number]  Status:  Final result  Visible to patient:  No (not released)  Next appt:  11/11/2019 at 09:45 AM in Oncology ([facility])  Dx:  Iron deficiency anemia, unspecified i...  Ref Range & Units 66mo ago  Folate >5.9 ng/mL 26.0   Comment: Performed at [facility], [address] [place], [place] [address]  Resulting Agency  [facility]      Specimen Collected: 09/24/19 10:42  Last Resulted: 09/24/19 12:57       Vitamin B12 Order: [id number] Status:  Final  result Visible to patient:  No (not released) Next appt:  11/11/2019 at 09:45 AM in Oncology ([facility]) Dx:  Iron deficiency anemia, unspecified i...  Ref Range & Units 75mo ago  Vitamin B-12 180 - 914 pg/mL 729   Comment: (NOTE)  This assay is not validated for testing neonatal or  myeloproliferative syndrome specimens for Vitamin B12 levels.  Performed at [facility], [place] [address], [place],   [address]   Resulting Agency  [facility]      Specimen Collected: 09/24/19 10:42 Last Resulted: 09/24/19 13:59

## 2019-11-10 ENCOUNTER — Inpatient Hospital Stay: Payer: 59 | Attending: Oncology

## 2019-11-10 ENCOUNTER — Other Ambulatory Visit: Payer: Self-pay

## 2019-11-10 ENCOUNTER — Encounter: Payer: Self-pay | Admitting: Oncology

## 2019-11-10 DIAGNOSIS — D509 Iron deficiency anemia, unspecified: Secondary | ICD-10-CM | POA: Insufficient documentation

## 2019-11-10 LAB — IRON AND TIBC
Iron: 55 ug/dL (ref 28–170)
Saturation Ratios: 18 % (ref 10.4–31.8)
TIBC: 310 ug/dL (ref 250–450)
UIBC: 255 ug/dL

## 2019-11-10 LAB — CBC
HCT: 42.6 % (ref 36.0–46.0)
Hemoglobin: 13.6 g/dL (ref 12.0–15.0)
MCH: 28.7 pg (ref 26.0–34.0)
MCHC: 31.9 g/dL (ref 30.0–36.0)
MCV: 89.9 fL (ref 80.0–100.0)
Platelets: 310 10*3/uL (ref 150–400)
RBC: 4.74 MIL/uL (ref 3.87–5.11)
RDW: 18.4 % — ABNORMAL HIGH (ref 11.5–15.5)
WBC: 4.9 10*3/uL (ref 4.0–10.5)
nRBC: 0 % (ref 0.0–0.2)

## 2019-11-10 LAB — FERRITIN: Ferritin: 34 ng/mL (ref 11–307)

## 2019-11-10 LAB — VITAMIN B12: Vitamin B-12: 1183 pg/mL — ABNORMAL HIGH (ref 180–914)

## 2019-11-10 NOTE — Progress Notes (Signed)
Patient stated that she had been doing well with no complaints. 

## 2019-11-11 ENCOUNTER — Inpatient Hospital Stay: Payer: 59

## 2019-11-11 ENCOUNTER — Inpatient Hospital Stay (HOSPITAL_BASED_OUTPATIENT_CLINIC_OR_DEPARTMENT_OTHER): Payer: 59 | Admitting: Oncology

## 2019-11-11 DIAGNOSIS — D509 Iron deficiency anemia, unspecified: Secondary | ICD-10-CM

## 2019-11-12 NOTE — Progress Notes (Signed)
I connected with Karen Randolph on 11/12/19 at 10:15 AM EST by video enabled telemedicine visit and verified that I am speaking with the correct person using two identifiers.   I discussed the limitations, risks, security and privacy concerns of performing an evaluation and management service by telemedicine and the availability of in-person appointments. I also discussed with the patient that there may be a patient responsible charge related to this service. The patient expressed understanding and agreed to proceed.  Other persons participating in the visit and their role in the encounter:  none  Patient's location:  home Provider's location:  Work  Diagnosis: Iron deficiency anemia  Chief Complaint: Routine follow-up of iron deficiency anemia  History of present illness: patient is a 53 year old African-American female Who recently underwent labs with her PCP and was found to have a hemoglobin of 7.8/24.9 with an MCV of 78 and relative thrombocytosis with a platelet count of 470.  Prior to that she has been getting yearly CBCs and her hemoglobin has been mostly around 11.2.  She has had intermittent leukopenia/mild neutropenia in the past.  She has undergone screening colonoscopy in 2019 by Dr. Vicente Randolph which was normal and a repeat colonoscopy was recommended in 10 years.  Since the diagnosis of iron deficiency patient has been taking oral iron tablets once a day.  She denies any consistent use of NSAIDs or Goody powder/BC powder.  She still gets her menstrual cycles but has been getting it less frequently.  They last for about 5 days and have been the same for the last several years.  She reports feeling fatigued but denies other complaints.  Denies any bright red blood in stools or dark tarry stools.  She has lost about 10 pounds in the last 6 months unintentionally.  Denies any family history of colon cancer.  Denies any gum bleeds, nosebleeds or bleeding in her urine  Interval history: Patient  reports that she has not had any menstrual cycles for about 2 months now.  Prior to that her menstrual cycles were at times heavy.  She is currently taking oral iron twice a day which she is tolerating well without any side effects.   Review of Systems  Constitutional: Negative for chills, fever, malaise/fatigue and weight loss.  HENT: Negative for congestion, ear discharge and nosebleeds.   Eyes: Negative for blurred vision.  Respiratory: Negative for cough, hemoptysis, sputum production, shortness of breath and wheezing.   Cardiovascular: Negative for chest pain, palpitations, orthopnea and claudication.  Gastrointestinal: Negative for abdominal pain, blood in stool, constipation, diarrhea, heartburn, melena, nausea and vomiting.  Genitourinary: Negative for dysuria, flank pain, frequency, hematuria and urgency.  Musculoskeletal: Negative for back pain, joint pain and myalgias.  Skin: Negative for rash.  Neurological: Negative for dizziness, tingling, focal weakness, seizures, weakness and headaches.  Endo/Heme/Allergies: Does not bruise/bleed easily.  Psychiatric/Behavioral: Negative for depression and suicidal ideas. The patient does not have insomnia.     Allergies  Allergen Reactions  . Shellfish Allergy     Past Medical History:  Diagnosis Date  . Anemia   . Thyroid disease     Past Surgical History:  Procedure Laterality Date  . BRAIN TUMOR EXCISION  2007,2010  . COLONOSCOPY WITH PROPOFOL N/A 10/12/2018   Procedure: COLONOSCOPY WITH PROPOFOL;  Surgeon: Jonathon Bellows, MD;  Location: [facility] ENDOSCOPY;  Service: Gastroenterology;  Laterality: N/A;    Social History   Socioeconomic History  . Marital status: Single    Spouse name: Not on file  .  Number of children: Not on file  . Years of education: Not on file  . Highest education level: Not on file  Occupational History  . Not on file  Social Needs  . Financial resource strain: Not on file  . Food insecurity     Worry: Not on file    Inability: Not on file  . Transportation needs    Medical: Not on file    Non-medical: Not on file  Tobacco Use  . Smoking status: Never Smoker  . Smokeless tobacco: Never Used  Substance and Sexual Activity  . Alcohol use: No    Alcohol/week: 0.0 standard drinks  . Drug use: No  . Sexual activity: Never  Lifestyle  . Physical activity    Days per week: Not on file    Minutes per session: Not on file  . Stress: Not on file  Relationships  . Social Herbalist on phone: Not on file    Gets together: Not on file    Attends religious service: Not on file    Active member of club or organization: Not on file    Attends meetings of clubs or organizations: Not on file    Relationship status: Not on file  . Intimate partner violence    Fear of current or ex partner: Not on file    Emotionally abused: Not on file    Physically abused: Not on file    Forced sexual activity: Not on file  Other Topics Concern  . Not on file  Social History Narrative  . Not on file    Family History  Problem Relation Age of Onset  . Alcohol abuse Mother   . CVA Father   . CAD Father   . Hypertension Father   . Heart attack Father   . Healthy Brother      Current Outpatient Medications:  .  EUTHYROX 50 MCG tablet, TAKE 1 TABLET BY MOUTH ONCE DAILY BEFORE BREAKFAST, Disp: 90 tablet, Rfl: 1 .  ferrous sulfate 325 (65 FE) MG tablet, Take 1 tablet by mouth daily., Disp: , Rfl:  .  fluticasone (FLONASE) 50 MCG/ACT nasal spray, Place 2 sprays into both nostrils daily., Disp: , Rfl:  .  Multiple Vitamin (MULTIVITAMIN) tablet, Take 1 tablet by mouth daily., Disp: , Rfl:  .  OMEGA-3 FATTY ACIDS PO, 1 tablet daily., Disp: , Rfl:  .  Vitamin D, Cholecalciferol, 400 UNITS TABS, Take 1 tablet by mouth daily., Disp: , Rfl:      CMP Latest Ref Rng & Units 08/30/2019  Glucose 65 - 99 mg/dL 87  BUN 6 - 24 mg/dL 8  Creatinine 0.57 - 1.00 mg/dL 0.88  Sodium 134 - 144  mmol/L 139  Potassium 3.5 - 5.2 mmol/L 3.9  Chloride 96 - 106 mmol/L 105  CO2 20 - 29 mmol/L 22  Calcium 8.7 - 10.2 mg/dL 9.5  Total Protein 6.0 - 8.5 g/dL 6.9  Total Bilirubin 0.0 - 1.2 mg/dL <0.2  Alkaline Phos 39 - 117 IU/L 69  AST 0 - 40 IU/L 17  ALT 0 - 32 IU/L 9   CBC Latest Ref Rng & Units 11/10/2019  WBC 4.0 - 10.5 K/uL 4.9  Hemoglobin 12.0 - 15.0 g/dL 13.6  Hematocrit 36.0 - 46.0 % 42.6  Platelets 150 - 400 K/uL 310     Observation/objective: Appears in no acute distress of a video visit today.  Breathing is nonlabored  Assessment and plan: Patient is a 53 year old  female with iron deficiency anemia possibly secondary to heavy menses.  This is a routine follow-up visit  Patient is likely perimenopausal at this time.  She has not had menstrual cycles for 2 months now.  Prior to that she reports that her menstrual cycles are occasionally heavy.Patient's hemoglobin was down to 7.8 back in September 2020.  After she started taking oral iron it has improved significantly and is up to 2013.6 today.  Also her iron studies are currently within normal limits.  She can go back to taking oral iron once a day.  If iron deficiency recurs without menorrhagia I will refer her back to Dr. Vicente Randolph who she has seen in the past and has had a colonoscopy in November 2019.  Follow-up instructions:Repeat CBC ferritin and iron studies in 3 in 6 months and I will see her back in 6 months    I discussed the assessment and treatment plan with the patient. The patient was provided an opportunity to ask questions and all were answered. The patient agreed with the plan and demonstrated an understanding of the instructions.   The patient was advised to call back or seek an in-person evaluation if the symptoms worsen or if the condition fails to improve as anticipated.    Visit Diagnosis: 1. Iron deficiency anemia, unspecified iron deficiency anemia type     Dr. Randa Evens, MD, MPH [facility] at [facility] Pager- [phone] 11/12/2019 12:03 PM

## 2020-01-27 ENCOUNTER — Other Ambulatory Visit: Payer: Self-pay | Admitting: Physician Assistant

## 2020-01-27 DIAGNOSIS — E039 Hypothyroidism, unspecified: Secondary | ICD-10-CM

## 2020-01-27 MED ORDER — LEVOTHYROXINE SODIUM 50 MCG PO TABS: ORAL_TABLET | ORAL | 1 refills | Status: DC

## 2020-01-27 NOTE — Telephone Encounter (Signed)
Medication Refill - Medication: EUTHYROX 50 MCG tablet  Has the patient contacted their pharmacy? no (Agent: If no, request that the patient contact the pharmacy for the refill.) (Agent: If yes, when and what did the pharmacy advise?)  Preferred Pharmacy (with phone number or street name):  [place], [place] Phone:  [phone]  Fax:  [phone]     Agent: Please be advised that RX refills may take up to 3 business days. We ask that you follow-up with your pharmacy.

## 2020-02-09 ENCOUNTER — Other Ambulatory Visit: Payer: Self-pay

## 2020-02-09 ENCOUNTER — Inpatient Hospital Stay: Payer: 59 | Attending: Oncology

## 2020-02-09 DIAGNOSIS — D509 Iron deficiency anemia, unspecified: Secondary | ICD-10-CM | POA: Diagnosis present

## 2020-02-09 LAB — CBC WITH DIFFERENTIAL/PLATELET
Abs Immature Granulocytes: 0.01 10*3/uL (ref 0.00–0.07)
Basophils Absolute: 0 10*3/uL (ref 0.0–0.1)
Basophils Relative: 0 %
Eosinophils Absolute: 0 10*3/uL (ref 0.0–0.5)
Eosinophils Relative: 1 %
HCT: 40.1 % (ref 36.0–46.0)
Hemoglobin: 12.7 g/dL (ref 12.0–15.0)
Immature Granulocytes: 0 %
Lymphocytes Relative: 30 %
Lymphs Abs: 0.9 10*3/uL (ref 0.7–4.0)
MCH: 31.4 pg (ref 26.0–34.0)
MCHC: 31.7 g/dL (ref 30.0–36.0)
MCV: 99.3 fL (ref 80.0–100.0)
Monocytes Absolute: 0.3 10*3/uL (ref 0.1–1.0)
Monocytes Relative: 11 %
Neutro Abs: 1.6 10*3/uL — ABNORMAL LOW (ref 1.7–7.7)
Neutrophils Relative %: 58 %
Platelets: 287 10*3/uL (ref 150–400)
RBC: 4.04 MIL/uL (ref 3.87–5.11)
RDW: 12 % (ref 11.5–15.5)
WBC: 2.9 10*3/uL — ABNORMAL LOW (ref 4.0–10.5)
nRBC: 0 % (ref 0.0–0.2)

## 2020-02-09 LAB — IRON AND TIBC
Iron: 51 ug/dL (ref 28–170)
Saturation Ratios: 16 % (ref 10.4–31.8)
TIBC: 316 ug/dL (ref 250–450)
UIBC: 265 ug/dL

## 2020-02-09 LAB — VITAMIN B12: Vitamin B-12: 1282 pg/mL — ABNORMAL HIGH (ref 180–914)

## 2020-02-09 LAB — FERRITIN: Ferritin: 25 ng/mL (ref 11–307)

## 2020-05-11 ENCOUNTER — Inpatient Hospital Stay: Payer: 59

## 2020-05-12 ENCOUNTER — Inpatient Hospital Stay: Payer: 59 | Admitting: Oncology

## 2020-05-26 ENCOUNTER — Other Ambulatory Visit: Payer: Self-pay

## 2020-05-26 ENCOUNTER — Inpatient Hospital Stay: Payer: 59 | Attending: Oncology

## 2020-05-26 DIAGNOSIS — D509 Iron deficiency anemia, unspecified: Secondary | ICD-10-CM | POA: Diagnosis present

## 2020-05-26 LAB — CBC WITH DIFFERENTIAL/PLATELET
Abs Immature Granulocytes: 0.02 10*3/uL (ref 0.00–0.07)
Basophils Absolute: 0 10*3/uL (ref 0.0–0.1)
Basophils Relative: 1 %
Eosinophils Absolute: 0 10*3/uL (ref 0.0–0.5)
Eosinophils Relative: 1 %
HCT: 33.5 % — ABNORMAL LOW (ref 36.0–46.0)
Hemoglobin: 11.2 g/dL — ABNORMAL LOW (ref 12.0–15.0)
Immature Granulocytes: 1 %
Lymphocytes Relative: 29 %
Lymphs Abs: 1 10*3/uL (ref 0.7–4.0)
MCH: 32.4 pg (ref 26.0–34.0)
MCHC: 33.4 g/dL (ref 30.0–36.0)
MCV: 96.8 fL (ref 80.0–100.0)
Monocytes Absolute: 0.3 10*3/uL (ref 0.1–1.0)
Monocytes Relative: 8 %
Neutro Abs: 2 10*3/uL (ref 1.7–7.7)
Neutrophils Relative %: 60 %
Platelets: 317 10*3/uL (ref 150–400)
RBC: 3.46 MIL/uL — ABNORMAL LOW (ref 3.87–5.11)
RDW: 13.7 % (ref 11.5–15.5)
WBC: 3.3 10*3/uL — ABNORMAL LOW (ref 4.0–10.5)
nRBC: 0 % (ref 0.0–0.2)

## 2020-05-26 LAB — IRON AND TIBC
Iron: 49 ug/dL (ref 28–170)
Saturation Ratios: 16 % (ref 10.4–31.8)
TIBC: 305 ug/dL (ref 250–450)
UIBC: 256 ug/dL

## 2020-05-26 LAB — VITAMIN B12: Vitamin B-12: 1452 pg/mL — ABNORMAL HIGH (ref 180–914)

## 2020-05-26 LAB — FERRITIN: Ferritin: 24 ng/mL (ref 11–307)

## 2020-05-29 ENCOUNTER — Inpatient Hospital Stay (HOSPITAL_BASED_OUTPATIENT_CLINIC_OR_DEPARTMENT_OTHER): Payer: 59 | Admitting: Oncology

## 2020-05-29 DIAGNOSIS — D509 Iron deficiency anemia, unspecified: Secondary | ICD-10-CM | POA: Diagnosis not present

## 2020-06-01 ENCOUNTER — Encounter: Payer: Self-pay | Admitting: Oncology

## 2020-06-01 NOTE — Progress Notes (Signed)
I connected with Karen Randolph on 06/01/20 at  2:15 PM EDT by video enabled telemedicine visit and verified that I am speaking with the correct person using two identifiers.   I discussed the limitations, risks, security and privacy concerns of performing an evaluation and management service by telemedicine and the availability of in-person appointments. I also discussed with the patient that there may be a patient responsible charge related to this service. The patient expressed understanding and agreed to proceed.  Other persons participating in the visit and their role in the encounter:  none  Patient's location:  home Provider's location:  work  Risk analyst Complaint:  Routine f/u of iron deficiency anemia  History of present illness: patient is a 54 year old African-American femaleWho recently underwent labs with her PCP and was found to have a hemoglobin of 7.8/24.9 with an MCV of 78 and relative thrombocytosis with a platelet count of 470. Prior to that she has been getting yearly CBCs and her hemoglobin has been mostly around 11.2. She has had intermittent leukopenia/mild neutropenia in the past. She has undergone screening colonoscopy in 2019 by Dr. Vicente Males which was normal and a repeat colonoscopy was recommended in 10 years. Since the diagnosis of iron deficiency patient has been taking oral iron tablets once a day. She denies any consistent use of NSAIDs or Goody powder/BC powder. She still gets her menstrual cycles but has been getting it less frequently. They last for about 5 days and have been the same for the last several years. She reports feeling fatigued but denies other complaints. Denies any bright red blood in stools or dark tarry stools. She has lost about 10 pounds in the last 6 months unintentionally. Denies any family history of colon cancer.Denies any gum bleeds, nosebleeds or bleeding in her urine   Interval history Patient did not have menstrual cycles for 3 months  then went on to have bleeding for 2 weeks. Overall doing well. Denies other complaints   Review of Systems  Constitutional: Negative for chills, fever, malaise/fatigue and weight loss.  HENT: Negative for congestion, ear discharge and nosebleeds.   Eyes: Negative for blurred vision.  Respiratory: Negative for cough, hemoptysis, sputum production, shortness of breath and wheezing.   Cardiovascular: Negative for chest pain, palpitations, orthopnea and claudication.  Gastrointestinal: Negative for abdominal pain, blood in stool, constipation, diarrhea, heartburn, melena, nausea and vomiting.  Genitourinary: Negative for dysuria, flank pain, frequency, hematuria and urgency.       Heavy menstrual bleeding  Musculoskeletal: Negative for back pain, joint pain and myalgias.  Skin: Negative for rash.  Neurological: Negative for dizziness, tingling, focal weakness, seizures, weakness and headaches.  Endo/Heme/Allergies: Does not bruise/bleed easily.  Psychiatric/Behavioral: Negative for depression and suicidal ideas. The patient does not have insomnia.     Allergies  Allergen Reactions  . Shellfish Allergy     Past Medical History:  Diagnosis Date  . Anemia   . Thyroid disease     Past Surgical History:  Procedure Laterality Date  . BRAIN TUMOR EXCISION  2007,2010  . COLONOSCOPY WITH PROPOFOL N/A 10/12/2018   Procedure: COLONOSCOPY WITH PROPOFOL;  Surgeon: Jonathon Bellows, MD;  Location: [facility] ENDOSCOPY;  Service: Gastroenterology;  Laterality: N/A;    Social History   Socioeconomic History  . Marital status: Single    Spouse name: Not on file  . Number of children: Not on file  . Years of education: Not on file  . Highest education level: Not on file  Occupational History  .  Not on file  Tobacco Use  . Smoking status: Never Smoker  . Smokeless tobacco: Never Used  Vaping Use  . Vaping Use: Never used  Substance and Sexual Activity  . Alcohol use: No    Alcohol/week: 0.0  standard drinks  . Drug use: No  . Sexual activity: Never  Other Topics Concern  . Not on file  Social History Narrative  . Not on file   Social Determinants of Health   Financial Resource Strain:   . Difficulty of Paying Living Expenses:   Food Insecurity:   . Worried About Charity fundraiser in the Last Year:   . Arboriculturist in the Last Year:   Transportation Needs:   . Film/video editor (Medical):   Marland Kitchen Lack of Transportation (Non-Medical):   Physical Activity:   . Days of Exercise per Week:   . Minutes of Exercise per Session:   Stress:   . Feeling of Stress :   Social Connections:   . Frequency of Communication with Friends and Family:   . Frequency of Social Gatherings with Friends and Family:   . Attends Religious Services:   . Active Member of Clubs or Organizations:   . Attends Archivist Meetings:   Marland Kitchen Marital Status:   Intimate Partner Violence:   . Fear of Current or Ex-Partner:   . Emotionally Abused:   Marland Kitchen Physically Abused:   . Sexually Abused:     Family History  Problem Relation Age of Onset  . Alcohol abuse Mother   . CVA Father   . CAD Father   . Hypertension Father   . Heart attack Father   . Healthy Brother      Current Outpatient Medications:  .  ferrous sulfate 325 (65 FE) MG tablet, Take 1 tablet by mouth daily., Disp: , Rfl:  .  levothyroxine (EUTHYROX) 50 MCG tablet, TAKE 1 TABLET BY MOUTH ONCE DAILY BEFORE BREAKFAST, Disp: 90 tablet, Rfl: 1 .  Multiple Vitamin (MULTIVITAMIN) tablet, Take 1 tablet by mouth daily., Disp: , Rfl:  .  OMEGA-3 FATTY ACIDS PO, 1 tablet daily., Disp: , Rfl:  .  Vitamin D, Cholecalciferol, 400 UNITS TABS, Take 1 tablet by mouth daily., Disp: , Rfl:  .  fluticasone (FLONASE) 50 MCG/ACT nasal spray, Place 2 sprays into both nostrils daily. (Patient not taking: Reported on 05/29/2020), Disp: , Rfl:   No results found.  No images are attached to the encounter.   CMP Latest Ref Rng & Units  08/30/2019  Glucose 65 - 99 mg/dL 87  BUN 6 - 24 mg/dL 8  Creatinine 0.57 - 1.00 mg/dL 0.88  Sodium 134 - 144 mmol/L 139  Potassium 3.5 - 5.2 mmol/L 3.9  Chloride 96 - 106 mmol/L 105  CO2 20 - 29 mmol/L 22  Calcium 8.7 - 10.2 mg/dL 9.5  Total Protein 6.0 - 8.5 g/dL 6.9  Total Bilirubin 0.0 - 1.2 mg/dL <0.2  Alkaline Phos 39 - 117 IU/L 69  AST 0 - 40 IU/L 17  ALT 0 - 32 IU/L 9   CBC Latest Ref Rng & Units 05/26/2020  WBC 4.0 - 10.5 K/uL 3.3(L)  Hemoglobin 12.0 - 15.0 g/dL 11.2(L)  Hematocrit 36 - 46 % 33.5(L)  Platelets 150 - 400 K/uL 317     Observation/objective:appears in no acute distress over video visit today. Breathing is non labored  Assessment and plan: Patient is a 53 year old female with iron deficiency anemia here for routine follow-up  Patient's hemoglobin improved from 7.8 in September 20 20-11.2 presently.  She has not required any IV iron so far and has been taking oral iron.  Her iron studies presently show low ferritin of 24 but iron studies are normal.  She is taking B12 supplements.  I will continue to monitor her iron studies every 3 months since there has been little bit of a drop in her hemoglobin from 12.7-11 presently.  Follow-up instructions: CBC ferritin and iron studies in 3 in 6 months and I will see her in 6 months for a video visit  I discussed the assessment and treatment plan with the patient. The patient was provided an opportunity to ask questions and all were answered. The patient agreed with the plan and demonstrated an understanding of the instructions.   The patient was advised to call back or seek an in-person evaluation if the symptoms worsen or if the condition fails to improve as anticipated.   Visit Diagnosis: 1. Iron deficiency anemia, unspecified iron deficiency anemia type     Dr. Randa Evens, MD, MPH [facility] Tel- [phone] 06/01/2020 12:58 PM

## 2020-07-24 ENCOUNTER — Other Ambulatory Visit: Payer: Self-pay | Admitting: Physician Assistant

## 2020-07-24 DIAGNOSIS — E039 Hypothyroidism, unspecified: Secondary | ICD-10-CM

## 2020-08-28 ENCOUNTER — Ambulatory Visit
Admission: RE | Admit: 2020-08-28 | Discharge: 2020-08-28 | Disposition: A | Discharge status: Home / Self Care | Payer: 59 | Source: Ambulatory Visit | Attending: Physician Assistant | Admitting: Physician Assistant

## 2020-08-28 ENCOUNTER — Other Ambulatory Visit: Payer: Self-pay

## 2020-08-28 DIAGNOSIS — Z1239 Encounter for other screening for malignant neoplasm of breast: Secondary | ICD-10-CM

## 2020-08-28 DIAGNOSIS — Z1231 Encounter for screening mammogram for malignant neoplasm of breast: Secondary | ICD-10-CM | POA: Diagnosis present

## 2020-08-29 ENCOUNTER — Telehealth: Payer: Self-pay

## 2020-08-29 ENCOUNTER — Inpatient Hospital Stay: Payer: 59 | Attending: Oncology

## 2020-08-29 DIAGNOSIS — D509 Iron deficiency anemia, unspecified: Secondary | ICD-10-CM | POA: Diagnosis not present

## 2020-08-29 LAB — CBC WITH DIFFERENTIAL/PLATELET
Abs Immature Granulocytes: 0.04 10*3/uL (ref 0.00–0.07)
Basophils Absolute: 0 10*3/uL (ref 0.0–0.1)
Basophils Relative: 1 %
Eosinophils Absolute: 0 10*3/uL (ref 0.0–0.5)
Eosinophils Relative: 1 %
HCT: 38.2 % (ref 36.0–46.0)
Hemoglobin: 13.3 g/dL (ref 12.0–15.0)
Immature Granulocytes: 1 %
Lymphocytes Relative: 28 %
Lymphs Abs: 1.2 10*3/uL (ref 0.7–4.0)
MCH: 32.8 pg (ref 26.0–34.0)
MCHC: 34.8 g/dL (ref 30.0–36.0)
MCV: 94.1 fL (ref 80.0–100.0)
Monocytes Absolute: 0.4 10*3/uL (ref 0.1–1.0)
Monocytes Relative: 10 %
Neutro Abs: 2.7 10*3/uL (ref 1.7–7.7)
Neutrophils Relative %: 59 %
Platelets: 246 10*3/uL (ref 150–400)
RBC: 4.06 MIL/uL (ref 3.87–5.11)
RDW: 12.4 % (ref 11.5–15.5)
WBC: 4.4 10*3/uL (ref 4.0–10.5)
nRBC: 0 % (ref 0.0–0.2)

## 2020-08-29 LAB — IRON AND TIBC
Iron: 79 ug/dL (ref 28–170)
Saturation Ratios: 26 % (ref 10.4–31.8)
TIBC: 301 ug/dL (ref 250–450)
UIBC: 222 ug/dL

## 2020-08-29 LAB — FERRITIN: Ferritin: 39 ng/mL (ref 11–307)

## 2020-08-29 NOTE — Telephone Encounter (Signed)
Patient advised as directed.

## 2020-08-29 NOTE — Telephone Encounter (Signed)
-----   Message from Mar Daring, [place] sent at 08/28/2020  1:00 PM EDT ----- Normal mammogram. Repeat screening in one year.

## 2020-09-04 ENCOUNTER — Ambulatory Visit (INDEPENDENT_AMBULATORY_CARE_PROVIDER_SITE_OTHER): Payer: 59 | Admitting: Physician Assistant

## 2020-09-04 ENCOUNTER — Other Ambulatory Visit: Payer: Self-pay | Admitting: Physician Assistant

## 2020-09-04 ENCOUNTER — Encounter: Payer: Self-pay | Admitting: Physician Assistant

## 2020-09-04 ENCOUNTER — Other Ambulatory Visit: Payer: Self-pay

## 2020-09-04 VITALS — BP 103/76 | HR 78 | Temp 98.2°F | Resp 16 | Ht 63.5 in | Wt 132.0 lb

## 2020-09-04 DIAGNOSIS — Z0000 Encounter for general adult medical examination without abnormal findings: Secondary | ICD-10-CM | POA: Diagnosis not present

## 2020-09-04 DIAGNOSIS — Z1159 Encounter for screening for other viral diseases: Secondary | ICD-10-CM

## 2020-09-04 DIAGNOSIS — Z23 Encounter for immunization: Secondary | ICD-10-CM | POA: Diagnosis not present

## 2020-09-04 DIAGNOSIS — Z13220 Encounter for screening for lipoid disorders: Secondary | ICD-10-CM

## 2020-09-04 DIAGNOSIS — R7309 Other abnormal glucose: Secondary | ICD-10-CM | POA: Diagnosis not present

## 2020-09-04 DIAGNOSIS — E039 Hypothyroidism, unspecified: Secondary | ICD-10-CM

## 2020-09-04 DIAGNOSIS — Z136 Encounter for screening for cardiovascular disorders: Secondary | ICD-10-CM

## 2020-09-04 NOTE — Progress Notes (Signed)
Complete physical exam   Patient: Karen Randolph   DOB: [birth year]   54 y.o. Female  MRN: [id number] Visit Date: 09/04/2020  Today's healthcare provider: Mar Daring, PA-C   Chief Complaint  Patient presents with  . Annual Exam   Subjective    Karen Randolph is a 54 y.o. female who presents today for a complete physical exam.  She reports consuming a low carb low sugar diet. Home exercise routine includes walks daily 2-3 miles. She generally feels well. She reports sleeping well. She does not have additional problems to discuss today.  HPI  Last pap:09/01/18-Pap is normal, HPV negative. Will repeat in 3-5 years. Last Mammogram:08/28/20-Normal mammogram. Repeat screening in one year. Last Colonoscopy: 10/12/18   Past Medical History:  Diagnosis Date  . Anemia   . Thyroid disease    Past Surgical History:  Procedure Laterality Date  . BRAIN TUMOR EXCISION  2007,2010  . COLONOSCOPY WITH PROPOFOL N/A 10/12/2018   Procedure: COLONOSCOPY WITH PROPOFOL;  Surgeon: Jonathon Bellows, MD;  Location: [facility] ENDOSCOPY;  Service: Gastroenterology;  Laterality: N/A;   Social History   Socioeconomic History  . Marital status: Single    Spouse name: Not on file  . Number of children: Not on file  . Years of education: Not on file  . Highest education level: Not on file  Occupational History  . Not on file  Tobacco Use  . Smoking status: Never Smoker  . Smokeless tobacco: Never Used  Vaping Use  . Vaping Use: Never used  Substance and Sexual Activity  . Alcohol use: No    Alcohol/week: 0.0 standard drinks  . Drug use: No  . Sexual activity: Never  Other Topics Concern  . Not on file  Social History Narrative  . Not on file   Social Determinants of Health   Financial Resource Randolph:   . Difficulty of Paying Living Expenses: Not on file  Food Insecurity:   . Worried About Charity fundraiser in the Last Year: Not on file  . Ran Out of Food in the Last Year:  Not on file  Transportation Needs:   . Lack of Transportation (Medical): Not on file  . Lack of Transportation (Non-Medical): Not on file  Physical Activity:   . Days of Exercise per Week: Not on file  . Minutes of Exercise per Session: Not on file  Stress:   . Feeling of Stress : Not on file  Social Connections:   . Frequency of Communication with Friends and Family: Not on file  . Frequency of Social Gatherings with Friends and Family: Not on file  . Attends Religious Services: Not on file  . Active Member of Clubs or Organizations: Not on file  . Attends Archivist Meetings: Not on file  . Marital Status: Not on file  Intimate Partner Violence:   . Fear of Current or Ex-Partner: Not on file  . Emotionally Abused: Not on file  . Physically Abused: Not on file  . Sexually Abused: Not on file   Family Status  Relation Name Status  . Mother  Deceased at age 38  . Father  Deceased at age 18  . MGM  Deceased  . MGF  Deceased  . PGM  Deceased  . PGF  Deceased  . Brother  (Not Specified)   Family History  Problem Relation Age of Onset  . Alcohol abuse Mother   . CVA Father   . CAD  Father   . Hypertension Father   . Heart attack Father   . Healthy Brother    Allergies  Allergen Reactions  . Shellfish Allergy     Patient Care Team: Mar Daring, PA-C as PCP - General (Family Medicine)   Medications: Outpatient Medications Prior to Visit  Medication Sig  . ferrous sulfate 325 (65 FE) MG tablet Take 1 tablet by mouth daily.  Marland Kitchen levothyroxine (SYNTHROID) 50 MCG tablet TAKE 1 TABLET BY MOUTH ONCE DAILY BEFORE BREAKFAST  . Multiple Vitamin (MULTIVITAMIN) tablet Take 1 tablet by mouth daily.  . OMEGA-3 FATTY ACIDS PO 1 tablet daily.  . Vitamin D, Cholecalciferol, 400 UNITS TABS Take 1 tablet by mouth daily.  . fluticasone (FLONASE) 50 MCG/ACT nasal spray Place 2 sprays into both nostrils daily. (Patient not taking: Reported on 05/29/2020)   No  facility-administered medications prior to visit.    Review of Systems  Constitutional: Negative.   HENT: Negative.   Eyes: Negative.   Respiratory: Negative.   Cardiovascular: Negative.   Gastrointestinal: Negative.   Endocrine: Negative.   Genitourinary: Negative.   Musculoskeletal: Negative.   Skin: Negative.   Allergic/Immunologic: Negative.   Neurological: Negative.   Hematological: Negative.   Psychiatric/Behavioral: Negative.        Objective    BP 103/76 (BP Location: Left Arm, Patient Position: Sitting, Cuff Size: Large)   Pulse 78   Temp 98.2 F (36.8 C) (Oral)   Resp 16   Ht 5' 3.5" (1.613 m)   Wt 132 lb (59.9 kg)   BMI 23.02 kg/m     Physical Exam Vitals reviewed.  Constitutional:      General: She is not in acute distress.    Appearance: Normal appearance. She is well-developed and normal weight. She is not ill-appearing or diaphoretic.  HENT:     Head: Normocephalic and atraumatic.     Right Ear: Tympanic membrane, ear canal and external ear normal.     Left Ear: Tympanic membrane, ear canal and external ear normal.     Nose: Nose normal.     Mouth/Throat:     Mouth: Mucous membranes are moist.     Pharynx: Oropharynx is clear. No oropharyngeal exudate or posterior oropharyngeal erythema.  Eyes:     General: No scleral icterus.       Right eye: No discharge.        Left eye: No discharge.     Extraocular Movements: Extraocular movements intact.     Conjunctiva/sclera: Conjunctivae normal.     Pupils: Pupils are equal, round, and reactive to light.  Neck:     Thyroid: No thyromegaly.     Vascular: No carotid bruit or JVD.     Trachea: No tracheal deviation.  Cardiovascular:     Rate and Rhythm: Normal rate and regular rhythm.     Pulses: Normal pulses.     Heart sounds: Normal heart sounds. No murmur heard.  No friction rub. No gallop.   Pulmonary:     Effort: Pulmonary effort is normal. No respiratory distress.     Breath sounds: Normal  breath sounds. No wheezing or rales.  Chest:     Chest wall: No tenderness.  Abdominal:     General: Abdomen is flat. Bowel sounds are normal. There is no distension.     Palpations: Abdomen is soft. There is no mass.     Tenderness: There is no abdominal tenderness. There is no guarding or rebound.  Musculoskeletal:  General: No tenderness. Normal range of motion.     Cervical back: Normal range of motion and neck supple.     Right lower leg: No edema.     Left lower leg: No edema.  Lymphadenopathy:     Cervical: No cervical adenopathy.  Skin:    General: Skin is warm and dry.     Capillary Refill: Capillary refill takes less than 2 seconds.     Findings: No rash.  Neurological:     General: No focal deficit present.     Mental Status: She is alert and oriented to person, place, and time. Mental status is at baseline.  Psychiatric:        Mood and Affect: Mood normal.        Behavior: Behavior normal.        Thought Content: Thought content normal.        Judgment: Judgment normal.      Last depression screening scores PHQ 2/9 Scores 09/04/2020 08/30/2019 08/27/2018  PHQ - 2 Score 0 0 0   Last fall risk screening Fall Risk  09/04/2020  Falls in the past year? 0  Number falls in past yr: 0  Injury with Fall? 0  Risk for fall due to : No Fall Risks  Follow up Falls evaluation completed   Last Audit-C alcohol use screening Alcohol Use Disorder Test (AUDIT) 09/04/2020  1. How often do you have a drink containing alcohol? 0  2. How many drinks containing alcohol do you have on a typical day when you are drinking? 0  3. How often do you have six or more drinks on one occasion? 0  AUDIT-C Score 0  Alcohol Brief Interventions/Follow-up AUDIT Score <7 follow-up not indicated   A score of 3 or more in women, and 4 or more in men indicates increased risk for alcohol abuse, EXCEPT if all of the points are from question 1   No results found for any visits on 09/04/20.   Assessment & Plan    Routine Health Maintenance and Physical Exam  Exercise Activities and Dietary recommendations Goals   None     Immunization History  Administered Date(s) Administered  . Influenza,inj,Quad PF,6+ Mos 10/10/2015, 08/04/2017, 08/27/2018, 08/30/2019, 09/04/2020  . Tdap 04/07/2008, 02/20/2018    Health Maintenance  Topic Date Due  . Hepatitis C Screening  Never done  . COVID-19 Vaccine (1) Never done  . MAMMOGRAM  08/28/2022  . PAP SMEAR-Modifier  08/28/2023  . TETANUS/TDAP  02/21/2028  . COLONOSCOPY  10/12/2028  . INFLUENZA VACCINE  Completed  . HIV Screening  Completed    Discussed health benefits of physical activity, and encouraged her to engage in regular exercise appropriate  for her age and condition.  1. Annual physical exam Normal physical exam today. Will check labs as below and f/u pending lab results. If labs are stable and WNL she will not need to have these rechecked for one year at her next annual physical exam. She is to call the office in the meantime if she has any acute issue, questions or concerns. - Comprehensive metabolic panel - Hemoglobin A1c - Lipid panel - TSH  2. Hypothyroidism, unspecified type Stable. Continue Levothyroxine 58mcg. Will check labs as below and f/u pending results. - TSH  3. Elevated hemoglobin A1c Diet controlled. Will check labs as below and f/u pending results.  4. Encounter for lipid screening for cardiovascular disease Will check labs as below and f/u pending results. - Lipid panel  5. Encounter for hepatitis C screening test for low risk patient Will check labs as below and f/u pending results. - Hepatitis C Antibody  6. Need for shingles vaccine Shingrix #1 Vaccine given to patient without complications. Patient sat for 15 minutes after administration and was tolerated well without adverse effects. Return in 2 months for #2. - Varicella-zoster vaccine IM (Shingrix)  7. Need for influenza  vaccination Flu vaccine given today without complication. Patient sat upright for 15 minutes to check for adverse reaction before being released. - Flu Vaccine QUAD 36+ mos IM   I, Mar Daring, PA-C, have reviewed all documentation for this visit. The documentation on 09/05/20 for the exam, diagnosis, procedures, and orders are all accurate and complete.  No follow-ups on file.      Rubye Beach  [facility] [phone] (phone) [phone] (fax)  [place]

## 2020-09-04 NOTE — Patient Instructions (Signed)
Health Maintenance, Female Adopting a healthy lifestyle and getting preventive care are important in promoting health and wellness. Ask your health care provider about:  The right schedule for you to have regular tests and exams.  Things you can do on your own to prevent diseases and keep yourself healthy. What should I know about diet, weight, and exercise? Eat a healthy diet   Eat a diet that includes plenty of vegetables, fruits, low-fat dairy products, and lean protein.  Do not eat a lot of foods that are high in solid fats, added sugars, or sodium. Maintain a healthy weight Body mass index (BMI) is used to identify weight problems. It estimates body fat based on height and weight. Your health care provider can help determine your BMI and help you achieve or maintain a healthy weight. Get regular exercise Get regular exercise. This is one of the most important things you can do for your health. Most adults should:  Exercise for at least 150 minutes each week. The exercise should increase your heart rate and make you sweat (moderate-intensity exercise).  Do strengthening exercises at least twice a week. This is in addition to the moderate-intensity exercise.  Spend less time sitting. Even light physical activity can be beneficial. Watch cholesterol and blood lipids Have your blood tested for lipids and cholesterol at 52 years of age, then have this test every 5 years. Have your cholesterol levels checked more often if:  Your lipid or cholesterol levels are high.  You are older than 52 years of age.  You are at high risk for heart disease. What should I know about cancer screening? Depending on your health history and family history, you may need to have cancer screening at various ages. This may include screening for:  Breast cancer.  Cervical cancer.  Colorectal cancer.  Skin cancer.  Lung cancer. What should I know about heart disease, diabetes, and high blood  pressure? Blood pressure and heart disease  High blood pressure causes heart disease and increases the risk of stroke. This is more likely to develop in people who have high blood pressure readings, are of African descent, or are overweight.  Have your blood pressure checked: ? Every 3-5 years if you are 18-39 years of age. ? Every year if you are 40 years old or older. Diabetes Have regular diabetes screenings. This checks your fasting blood sugar level. Have the screening done:  Once every three years after age 40 if you are at a normal weight and have a low risk for diabetes.  More often and at a younger age if you are overweight or have a high risk for diabetes. What should I know about preventing infection? Hepatitis B If you have a higher risk for hepatitis B, you should be screened for this virus. Talk with your health care provider to find out if you are at risk for hepatitis B infection. Hepatitis C Testing is recommended for:  Everyone born from 1945 through 1965.  Anyone with known risk factors for hepatitis C. Sexually transmitted infections (STIs)  Get screened for STIs, including gonorrhea and chlamydia, if: ? You are sexually active and are younger than 52 years of age. ? You are older than 52 years of age and your health care provider tells you that you are at risk for this type of infection. ? Your sexual activity has changed since you were last screened, and you are at increased risk for chlamydia or gonorrhea. Ask your health care provider if   you are at risk.  Ask your health care provider about whether you are at high risk for HIV. Your health care provider may recommend a prescription medicine to help prevent HIV infection. If you choose to take medicine to prevent HIV, you should first get tested for HIV. You should then be tested every 3 months for as long as you are taking the medicine. Pregnancy  If you are about to stop having your period (premenopausal) and  you may become pregnant, seek counseling before you get pregnant.  Take 400 to 800 micrograms (mcg) of folic acid every day if you become pregnant.  Ask for birth control (contraception) if you want to prevent pregnancy. Osteoporosis and menopause Osteoporosis is a disease in which the bones lose minerals and strength with aging. This can result in bone fractures. If you are 65 years old or older, or if you are at risk for osteoporosis and fractures, ask your health care provider if you should:  Be screened for bone loss.  Take a calcium or vitamin D supplement to lower your risk of fractures.  Be given hormone replacement therapy (HRT) to treat symptoms of menopause. Follow these instructions at home: Lifestyle  Do not use any products that contain nicotine or tobacco, such as cigarettes, e-cigarettes, and chewing tobacco. If you need help quitting, ask your health care provider.  Do not use street drugs.  Do not share needles.  Ask your health care provider for help if you need support or information about quitting drugs. Alcohol use  Do not drink alcohol if: ? Your health care provider tells you not to drink. ? You are pregnant, may be pregnant, or are planning to become pregnant.  If you drink alcohol: ? Limit how much you use to 0-1 drink a day. ? Limit intake if you are breastfeeding.  Be aware of how much alcohol is in your drink. In the [place] one drink equals one 12 oz bottle of beer (355 mL), one 5 oz glass of wine (148 mL), or one 1 oz glass of hard liquor (44 mL). General instructions  Schedule regular health, dental, and eye exams.  Stay current with your vaccines.  Tell your health care provider if: ? You often feel depressed. ? You have ever been abused or do not feel safe at home. Summary  Adopting a healthy lifestyle and getting preventive care are important in promoting health and wellness.  Follow your health care provider's instructions about healthy  diet, exercising, and getting tested or screened for diseases.  Follow your health care provider's instructions on monitoring your cholesterol and blood pressure. This information is not intended to replace advice given to you by your health care provider. Make sure you discuss any questions you have with your health care provider. Document Revised: 11/18/2018 Document Reviewed: 11/18/2018 Elsevier Patient Education  2020 Elsevier Inc.  

## 2020-09-05 LAB — COMPREHENSIVE METABOLIC PANEL
ALT: 20 IU/L (ref 0–32)
AST: 24 IU/L (ref 0–40)
Albumin/Globulin Ratio: 1.8 (ref 1.2–2.2)
Albumin: 4.5 g/dL (ref 3.8–4.9)
Alkaline Phosphatase: 54 IU/L (ref 44–121)
BUN/Creatinine Ratio: 8 — ABNORMAL LOW (ref 9–23)
BUN: 7 mg/dL (ref 6–24)
Bilirubin Total: 0.2 mg/dL (ref 0.0–1.2)
CO2: 23 mmol/L (ref 20–29)
Calcium: 10 mg/dL (ref 8.7–10.2)
Chloride: 103 mmol/L (ref 96–106)
Creatinine, Ser: 0.83 mg/dL (ref 0.57–1.00)
GFR calc Af Amer: 94 mL/min/{1.73_m2} (ref >59)
GFR calc non Af Amer: 81 mL/min/{1.73_m2} (ref >59)
Globulin, Total: 2.5 g/dL (ref 1.5–4.5)
Glucose: 76 mg/dL (ref 65–99)
Potassium: 4.2 mmol/L (ref 3.5–5.2)
Sodium: 139 mmol/L (ref 134–144)
Total Protein: 7 g/dL (ref 6.0–8.5)

## 2020-09-05 LAB — HEMOGLOBIN A1C
Est. average glucose Bld gHb Est-mCnc: 103 mg/dL
Hgb A1c MFr Bld: 5.2 % (ref 4.8–5.6)

## 2020-09-05 LAB — LIPID PANEL
Chol/HDL Ratio: 1.7 ratio (ref 0.0–4.4)
Cholesterol, Total: 198 mg/dL (ref 100–199)
HDL: 115 mg/dL (ref >39)
LDL Chol Calc (NIH): 76 mg/dL (ref 0–99)
Triglycerides: 35 mg/dL (ref 0–149)
VLDL Cholesterol Cal: 7 mg/dL (ref 5–40)

## 2020-09-05 LAB — HEPATITIS C ANTIBODY: Hep C Virus Ab: <0.1 s/co ratio (ref 0.0–0.9)

## 2020-09-05 LAB — TSH: TSH: 2.01 u[IU]/mL (ref 0.450–4.500)

## 2020-09-06 ENCOUNTER — Telehealth: Payer: Self-pay

## 2020-09-06 NOTE — Telephone Encounter (Signed)
LMTCB 09/06/2028.  PEC please advise pt of labs results below.   Thanks,   -Mickel Baas

## 2020-09-06 NOTE — Telephone Encounter (Signed)
-----   Message from Mar Daring, [place] sent at 09/05/2020  4:18 PM EDT ----- Kidney and liver function are normal. Sodium, potassium and calcium are normal. Cholesterol is normal. A1c/sugar is normal. Thyroid is normal. Hepatitis C screen is negative.

## 2020-09-06 NOTE — Telephone Encounter (Signed)
Tried calling patient no ring tone. Will try at another time. If patient calls back ok for [facility] Nurse to give lab results.

## 2020-09-08 NOTE — Telephone Encounter (Signed)
Patient advised as directed below. 

## 2020-10-20 ENCOUNTER — Other Ambulatory Visit: Payer: Self-pay | Admitting: Physician Assistant

## 2020-10-20 DIAGNOSIS — E039 Hypothyroidism, unspecified: Secondary | ICD-10-CM

## 2020-10-20 MED ORDER — LEVOTHYROXINE SODIUM 50 MCG PO TABS: ORAL_TABLET | ORAL | 3 refills | Status: DC

## 2020-10-20 NOTE — Telephone Encounter (Signed)
Medication: levothyroxine (SYNTHROID) 50 MCG tablet [[id number]]   Has the patient contacted their pharmacy? YES (Agent: If no, request that the patient contact the pharmacy for the refill.) (Agent: If yes, when and what did the pharmacy advise?)  Preferred Pharmacy (with phone number or street name): [facility] [address] [place]  [address] [place] [address]  Phone:  [phone] Fax:  [phone]   Agent: Please be advised that RX refills may take up to 3 business days. We ask that you follow-up with your pharmacy.

## 2020-10-20 NOTE — Telephone Encounter (Signed)
Requested Prescriptions  Pending Prescriptions Disp Refills  . levothyroxine (SYNTHROID) 50 MCG tablet 90 tablet 3    Sig: TAKE 1 TABLET BY MOUTH ONCE DAILY BEFORE BREAKFAST     Endocrinology:  Hypothyroid Agents Failed - 10/20/2020 10:28 AM      Failed - TSH needs to be rechecked within 3 months after an abnormal result. Refill until TSH is due.      Passed - TSH in normal range and within 360 days    TSH  Date Value Ref Range Status  09/04/2020 2.010 0.450 - 4.500 uIU/mL Final         Passed - Valid encounter within last 12 months    Recent Outpatient Visits          1 month ago Annual physical exam   [facility] [place], Clearnce Sorrel, [place]   1 year ago Annual physical exam   [facility] [place], Clearnce Sorrel, [place]   2 years ago Annual physical exam   [place], Clearnce Sorrel, [place]   2 years ago Strain of lumbar region, initial encounter   [place], [place]   2 years ago Low glucose level   [facility], Clearnce Sorrel, [place]      Future Appointments            In 1 month Sindy Guadeloupe, MD [facility]   In 10 months Marlyn Corporal, [place]rel, PA-C [employer], [place]

## 2020-11-06 ENCOUNTER — Other Ambulatory Visit: Payer: Self-pay

## 2020-11-06 ENCOUNTER — Ambulatory Visit (INDEPENDENT_AMBULATORY_CARE_PROVIDER_SITE_OTHER): Payer: 59 | Admitting: Physician Assistant

## 2020-11-06 DIAGNOSIS — Z23 Encounter for immunization: Secondary | ICD-10-CM | POA: Diagnosis not present

## 2020-11-06 NOTE — Progress Notes (Signed)
Shingrix Vaccine #2 given to patient without complications. Patient sat for 15 minutes after administration and was tolerated well without adverse effects. 

## 2020-11-27 ENCOUNTER — Inpatient Hospital Stay: Payer: 59 | Attending: Oncology

## 2020-11-27 DIAGNOSIS — D509 Iron deficiency anemia, unspecified: Secondary | ICD-10-CM | POA: Insufficient documentation

## 2020-11-27 LAB — CBC WITH DIFFERENTIAL/PLATELET
Abs Immature Granulocytes: 0.02 K/uL (ref 0.00–0.07)
Basophils Absolute: 0 K/uL (ref 0.0–0.1)
Basophils Relative: 1 %
Eosinophils Absolute: 0 K/uL (ref 0.0–0.5)
Eosinophils Relative: 1 %
HCT: 40.6 % (ref 36.0–46.0)
Hemoglobin: 14 g/dL (ref 12.0–15.0)
Immature Granulocytes: 1 %
Lymphocytes Relative: 36 %
Lymphs Abs: 1.3 K/uL (ref 0.7–4.0)
MCH: 33.5 pg (ref 26.0–34.0)
MCHC: 34.5 g/dL (ref 30.0–36.0)
MCV: 97.1 fL (ref 80.0–100.0)
Monocytes Absolute: 0.4 K/uL (ref 0.1–1.0)
Monocytes Relative: 11 %
Neutro Abs: 1.9 K/uL (ref 1.7–7.7)
Neutrophils Relative %: 50 %
Platelets: 248 K/uL (ref 150–400)
RBC: 4.18 MIL/uL (ref 3.87–5.11)
RDW: 11.8 % (ref 11.5–15.5)
WBC: 3.7 K/uL — ABNORMAL LOW (ref 4.0–10.5)
nRBC: 0 % (ref 0.0–0.2)

## 2020-11-27 LAB — IRON AND TIBC
Iron: 65 ug/dL (ref 28–170)
Saturation Ratios: 20 % (ref 10.4–31.8)
TIBC: 330 ug/dL (ref 250–450)
UIBC: 265 ug/dL

## 2020-11-27 LAB — FERRITIN: Ferritin: 35 ng/mL (ref 11–307)

## 2020-11-28 ENCOUNTER — Other Ambulatory Visit: Payer: Self-pay

## 2020-11-28 ENCOUNTER — Inpatient Hospital Stay (HOSPITAL_BASED_OUTPATIENT_CLINIC_OR_DEPARTMENT_OTHER): Payer: 59 | Admitting: Oncology

## 2020-11-28 ENCOUNTER — Encounter: Payer: Self-pay | Admitting: Oncology

## 2020-11-28 DIAGNOSIS — D509 Iron deficiency anemia, unspecified: Secondary | ICD-10-CM | POA: Diagnosis not present

## 2020-11-28 NOTE — Progress Notes (Signed)
Pt contacted for Myhchart visit. No new concerns voiced.

## 2020-11-30 NOTE — Progress Notes (Signed)
I connected with Karen Randolph on 11/30/20 at  2:45 PM EST by video enabled telemedicine visit and verified that I am speaking with the correct person using two identifiers.   I discussed the limitations, risks, security and privacy concerns of performing an evaluation and management service by telemedicine and the availability of in-person appointments. I also discussed with the patient that there may be a patient responsible charge related to this service. The patient expressed understanding and agreed to proceed.  Other persons participating in the visit and their role in the encounter:  none  Patient's location:  work Provider's location:  work  Risk analyst Complaint:  Routine f/u of iron deficiency anemia  History of present illness: patient is a 54 year old African-American femaleWho recently underwent labs with her PCP and was found to have a hemoglobin of 7.8/24.9 with an MCV of 78 and relative thrombocytosis with a platelet count of 470. Prior to that she has been getting yearly CBCs and her hemoglobin has been mostly around 11.2. She has had intermittent leukopenia/mild neutropenia in the past. She has undergone screening colonoscopy in 2019 by Dr. Vicente Males which was normal and a repeat colonoscopy was recommended in 10 years. Since the diagnosis of iron deficiency patient has been taking oral iron tablets once a day. She denies any consistent use of NSAIDs or Goody powder/BC powder. She still gets her menstrual cycles but has been getting it less frequently. They can be sometimes heavy and she does see GYN for this as well.   Interval history over the last 2 to 3 months patient has only had some spotting but no distinct menstrua Cycles.  She denies any dark melanotic stools or bright red blood in stools   Review of Systems  Constitutional: Negative for chills, fever, malaise/fatigue and weight loss.  HENT: Negative for congestion, ear discharge and nosebleeds.   Eyes: Negative for  blurred vision.  Respiratory: Negative for cough, hemoptysis, sputum production, shortness of breath and wheezing.   Cardiovascular: Negative for chest pain, palpitations, orthopnea and claudication.  Gastrointestinal: Negative for abdominal pain, blood in stool, constipation, diarrhea, heartburn, melena, nausea and vomiting.  Genitourinary: Negative for dysuria, flank pain, frequency, hematuria and urgency.  Musculoskeletal: Negative for back pain, joint pain and myalgias.  Skin: Negative for rash.  Neurological: Negative for dizziness, tingling, focal weakness, seizures, weakness and headaches.  Endo/Heme/Allergies: Does not bruise/bleed easily.  Psychiatric/Behavioral: Negative for depression and suicidal ideas. The patient does not have insomnia.     Allergies  Allergen Reactions  . Shellfish Allergy     Past Medical History:  Diagnosis Date  . Anemia   . Thyroid disease     Past Surgical History:  Procedure Laterality Date  . BRAIN TUMOR EXCISION  2007,2010  . COLONOSCOPY WITH PROPOFOL N/A 10/12/2018   Procedure: COLONOSCOPY WITH PROPOFOL;  Surgeon: Jonathon Bellows, MD;  Location: [facility] ENDOSCOPY;  Service: Gastroenterology;  Laterality: N/A;    Social History   Socioeconomic History  . Marital status: Single    Spouse name: Not on file  . Number of children: Not on file  . Years of education: Not on file  . Highest education level: Not on file  Occupational History  . Not on file  Tobacco Use  . Smoking status: Never Smoker  . Smokeless tobacco: Never Used  Vaping Use  . Vaping Use: Never used  Substance and Sexual Activity  . Alcohol use: No    Alcohol/week: 0.0 standard drinks  . Drug use: No  .  Sexual activity: Never  Other Topics Concern  . Not on file  Social History Narrative  . Not on file   Social Determinants of Health   Financial Resource Strain: Not on file  Food Insecurity: Not on file  Transportation Needs: Not on file  Physical Activity: Not  on file  Stress: Not on file  Social Connections: Not on file  Intimate Partner Violence: Not on file    Family History  Problem Relation Age of Onset  . Alcohol abuse Mother   . CVA Father   . CAD Father   . Hypertension Father   . Heart attack Father   . Healthy Brother      Current Outpatient Medications:  .  ferrous sulfate 325 (65 FE) MG tablet, Take 2 tablets by mouth daily., Disp: , Rfl:  .  levothyroxine (SYNTHROID) 50 MCG tablet, TAKE 1 TABLET BY MOUTH ONCE DAILY BEFORE BREAKFAST, Disp: 90 tablet, Rfl: 3 .  Multiple Vitamin (MULTIVITAMIN) tablet, Take 1 tablet by mouth daily., Disp: , Rfl:  .  OMEGA-3 FATTY ACIDS PO, 1 tablet daily., Disp: , Rfl:  .  Vitamin D, Cholecalciferol, 400 UNITS TABS, Take 1 tablet by mouth daily., Disp: , Rfl:  .  fluticasone (FLONASE) 50 MCG/ACT nasal spray, Place 2 sprays into both nostrils daily. (Patient not taking: No sig reported), Disp: , Rfl:   No results found.  No images are attached to the encounter.   CMP Latest Ref Rng & Units 09/04/2020  Glucose 65 - 99 mg/dL 76  BUN 6 - 24 mg/dL 7  Creatinine 0.57 - 1.00 mg/dL 0.83  Sodium 134 - 144 mmol/L 139  Potassium 3.5 - 5.2 mmol/L 4.2  Chloride 96 - 106 mmol/L 103  CO2 20 - 29 mmol/L 23  Calcium 8.7 - 10.2 mg/dL 10.0  Total Protein 6.0 - 8.5 g/dL 7.0  Total Bilirubin 0.0 - 1.2 mg/dL 0.2  Alkaline Phos 44 - 121 IU/L 54  AST 0 - 40 IU/L 24  ALT 0 - 32 IU/L 20   CBC Latest Ref Rng & Units 11/27/2020  WBC 4.0 - 10.5 K/uL 3.7(L)  Hemoglobin 12.0 - 15.0 g/dL 14.0  Hematocrit 36.0 - 46.0 % 40.6  Platelets 150 - 400 K/uL 248     Observation/objective: Appears in no acute distress over video visit today.  Breathing is nonlabored  Assessment and plan: Patient is a 54 year old female with history of iron deficiency anemia and this is a routine follow-up visit   Patient has never required IV iron so far and is presently on oral iron.  Her hemoglobin which was down to 7.8 in  September 2020 is now up to 14.  Ferritin levels are low normal at 35 with normal iron studies.  She does not require any IV iron at this time.  Repeat CBC ferritin and iron studies in 4 and 8 months and I will see her in 8 months.  If her iron deficiency anemia recurs at some point I will refer her back to GI to rule out any other GI causes of blood loss  Follow-up instructions: As above  I discussed the assessment and treatment plan with the patient. The patient was provided an opportunity to ask questions and all were answered. The patient agreed with the plan and demonstrated an understanding of the instructions.   The patient was advised to call back or seek an in-person evaluation if the symptoms worsen or if the condition fails to improve as anticipated.  Visit Diagnosis: 1. Iron deficiency anemia, unspecified iron deficiency anemia type     Dr. Randa Evens, MD, MPH [facility] at [facility] Tel- [phone] 11/30/2020 8:15 AM

## 2021-03-29 ENCOUNTER — Inpatient Hospital Stay: Payer: 59 | Attending: Oncology

## 2021-06-22 ENCOUNTER — Other Ambulatory Visit: Payer: Self-pay

## 2021-06-22 ENCOUNTER — Ambulatory Visit (INDEPENDENT_AMBULATORY_CARE_PROVIDER_SITE_OTHER): Payer: 59 | Admitting: Family Medicine

## 2021-06-22 ENCOUNTER — Encounter: Payer: Self-pay | Admitting: Family Medicine

## 2021-06-22 VITALS — BP 107/76 | HR 74 | Resp 16 | Ht 64.0 in | Wt 147.0 lb

## 2021-06-22 DIAGNOSIS — R7309 Other abnormal glucose: Secondary | ICD-10-CM

## 2021-06-22 DIAGNOSIS — E039 Hypothyroidism, unspecified: Secondary | ICD-10-CM

## 2021-06-22 DIAGNOSIS — M25471 Effusion, right ankle: Secondary | ICD-10-CM | POA: Diagnosis not present

## 2021-06-22 DIAGNOSIS — D509 Iron deficiency anemia, unspecified: Secondary | ICD-10-CM | POA: Diagnosis not present

## 2021-06-22 DIAGNOSIS — M25472 Effusion, left ankle: Secondary | ICD-10-CM

## 2021-06-22 NOTE — Progress Notes (Signed)
I,April Miller,acting as a Education administrator for [employer], PA-C.,have documented all relevant documentation on the behalf of Vernie Murders, PA-C,as directed by  [employer], PA-C while in the presence of [employer], PA-C.   Established patient visit   Patient: Karen Randolph   DOB: February 29, 1966   55 y.o. Female  MRN: [id number] Visit Date: 06/22/2021  Today's healthcare provider: Vernie Murders, PA-C   No chief complaint on file.  Subjective    HPI  Patient is here to discuss lumps that she noticed 3 weeks ago on her outer ankles. Patient states she is not having any pain or discomfort, she is just concerned.  Patient also wants to discuss recent weight gain. Patient states she has gain around 20 lbs in the last several months. She states she has not changed anything in her diet or routine.  Past Medical History:  Diagnosis Date   Anemia    Thyroid disease    Past Surgical History:  Procedure Laterality Date   BRAIN TUMOR EXCISION  2007,2010   COLONOSCOPY WITH PROPOFOL N/A 10/12/2018   Procedure: COLONOSCOPY WITH PROPOFOL;  Surgeon: Jonathon Bellows, MD;  Location: [facility] ENDOSCOPY;  Service: Gastroenterology;  Laterality: N/A;   Social History   Tobacco Use   Smoking status: Never   Smokeless tobacco: Never  Vaping Use   Vaping Use: Never used  Substance Use Topics   Alcohol use: No    Alcohol/week: 0.0 standard drinks   Drug use: No   Family Status  Relation Name Status   Mother  Deceased at age 50   Father  Deceased at age 53   MGM  Deceased   MGF  Deceased   PGM  Deceased   PGF  Deceased   Brother  (Not Specified)   Allergies  Allergen Reactions   Shellfish Allergy        Medications: Outpatient Medications Prior to Visit  Medication Sig   ferrous sulfate 325 (65 FE) MG tablet Take 2 tablets by mouth daily.   levothyroxine (SYNTHROID) 50 MCG tablet TAKE 1 TABLET BY MOUTH ONCE DAILY BEFORE BREAKFAST   Multiple Vitamin (MULTIVITAMIN) tablet Take  1 tablet by mouth daily.   OMEGA-3 FATTY ACIDS PO 1 tablet daily.   Vitamin D, Cholecalciferol, 400 UNITS TABS Take 1 tablet by mouth daily.   [DISCONTINUED] fluticasone (FLONASE) 50 MCG/ACT nasal spray Place 2 sprays into both nostrils daily. (Patient not taking: No sig reported)   No facility-administered medications prior to visit.    Review of Systems  Constitutional:  Negative for appetite change, chills, fatigue and fever.  Respiratory:  Negative for chest tightness and shortness of breath.   Cardiovascular:  Negative for chest pain and palpitations.  Gastrointestinal:  Negative for abdominal pain, nausea and vomiting.  Neurological:  Negative for dizziness and weakness.      Objective    BP 107/76 (BP Location: Right Arm, Patient Position: Sitting, Cuff Size: Normal)   Pulse 74   Resp 16   Ht 5\' 4"  (1.626 m)   Wt 147 lb (66.7 kg)   LMP 06/08/2020   SpO2 98%   BMI 25.23 kg/m  BP Readings from Last 3 Encounters:  06/22/21 107/76  09/04/20 103/76  09/10/19 105/78   Wt Readings from Last 3 Encounters:  06/22/21 147 lb (66.7 kg)  09/04/20 132 lb (59.9 kg)  09/10/19 134 lb (60.8 kg)    Physical Exam Constitutional:      General: She is not in acute  distress.    Appearance: She is well-developed.  HENT:     Head: Normocephalic and atraumatic.     Right Ear: Hearing and tympanic membrane normal.     Left Ear: Hearing and tympanic membrane normal.     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    Nose: Nose normal.  Eyes:     General: Lids are normal. No scleral icterus.       Right eye: No discharge.        Left eye: No discharge.     Conjunctiva/sclera: Conjunctivae normal.  Cardiovascular:     Rate and Rhythm: Normal rate and regular rhythm.     Pulses: Normal pulses.     Heart sounds: Normal heart sounds.  Pulmonary:     Effort: Pulmonary effort is normal. No respiratory distress.     Breath sounds: Normal breath  sounds.  Musculoskeletal:        General: Swelling present. Normal range of motion.     Comments: Questionable cystic lesion below both lateral malleoli each ankle.  Skin:    Findings: No lesion or rash.  Neurological:     Mental Status: She is alert and oriented to person, place, and time.  Psychiatric:        Speech: Speech normal.        Behavior: Behavior normal.        Thought Content: Thought content normal.      No results found for any visits on 06/22/21.  Assessment & Plan     1. Swelling of both ankles Over the past week , noticed swelling below both malleoli. Feels like cystic lesions without history of injury or pain. Full ROM both ankles with good pulses. Will check labs. - CBC with Differential/Platelet - Comprehensive metabolic panel  2. Hypothyroidism, unspecified type Taking Levothyroxine 50 mcg qd. Unsure if weight gain, heat intolerance and cysts on ankles could be signs of thyroid disease changes. Recheck labs. - CBC with Differential/Platelet - Comprehensive metabolic panel - TSH - T4  3. Iron deficiency anemia, unspecified iron deficiency anemia type Followed by Dr. Janese Banks (hematologist). Still taking iron supplement. Recheck labs. - CBC with Differential/Platelet - TSH  4. Elevated hemoglobin A1c History of prediabetes Hgb A1C at 5.7 on 09-04-20. Has gained 15 lbs. Recheck labs. No polyuria, polydipsia or vision disturbance. - CBC with Differential/Platelet - Comprehensive metabolic panel   No follow-ups on file.      I, Taytum Wheller, PA-C, have reviewed all documentation for this visit. The documentation on 06/22/21 for the exam, diagnosis, procedures, and orders are all accurate and complete.    Vernie Murders, PA-C  [employer] [phone] (phone) [phone] (fax)  [place]

## 2021-07-30 ENCOUNTER — Other Ambulatory Visit: Payer: Self-pay | Admitting: *Deleted

## 2021-07-30 ENCOUNTER — Encounter: Payer: Self-pay | Admitting: Oncology

## 2021-07-30 ENCOUNTER — Inpatient Hospital Stay: Payer: 59 | Attending: Oncology

## 2021-07-30 ENCOUNTER — Inpatient Hospital Stay (HOSPITAL_BASED_OUTPATIENT_CLINIC_OR_DEPARTMENT_OTHER): Payer: 59 | Admitting: Oncology

## 2021-07-30 ENCOUNTER — Other Ambulatory Visit: Payer: Self-pay

## 2021-07-30 DIAGNOSIS — D509 Iron deficiency anemia, unspecified: Secondary | ICD-10-CM | POA: Diagnosis not present

## 2021-07-30 LAB — FERRITIN: Ferritin: 91 ng/mL (ref 11–307)

## 2021-07-30 LAB — IRON AND TIBC
Iron: 66 ug/dL (ref 28–170)
Saturation Ratios: 20 % (ref 10.4–31.8)
TIBC: 333 ug/dL (ref 250–450)
UIBC: 267 ug/dL

## 2021-07-30 LAB — CBC
HCT: 39.2 % (ref 36.0–46.0)
Hemoglobin: 13.1 g/dL (ref 12.0–15.0)
MCH: 32 pg (ref 26.0–34.0)
MCHC: 33.4 g/dL (ref 30.0–36.0)
MCV: 95.8 fL (ref 80.0–100.0)
Platelets: 309 10*3/uL (ref 150–400)
RBC: 4.09 MIL/uL (ref 3.87–5.11)
RDW: 11.9 % (ref 11.5–15.5)
WBC: 3.8 10*3/uL — ABNORMAL LOW (ref 4.0–10.5)
nRBC: 0 % (ref 0.0–0.2)

## 2021-07-30 NOTE — Progress Notes (Signed)
Patient called/ pre- screened for virtual appoinment today with oncologist. No new concerns  

## 2021-07-30 NOTE — Progress Notes (Signed)
I connected with Karen Randolph on 07/30/21 at  2:30 PM EDT by telephone visit and verified that I am speaking with the correct person using two identifiers.   I discussed the limitations, risks, security and privacy concerns of performing an evaluation and management service by telemedicine and the availability of in-person appointments. I also discussed with the patient that there may be a patient responsible charge related to this service. The patient expressed understanding and agreed to proceed.  Other persons participating in the visit and their role in the encounter:  none  Patient's location:  home Provider's location:  home  Chief Complaint:  routine f/u of iron deficiency anemia  History of present illness: patient is a 55 year old African-American female Who recently underwent labs with her PCP and was found to have a hemoglobin of 7.8/24.9 with an MCV of 78 and relative thrombocytosis with a platelet count of 470.  Prior to that she has been getting yearly CBCs and her hemoglobin has been mostly around 11.2.  She has had intermittent leukopenia/mild neutropenia in the past.  She has undergone screening colonoscopy in 2019 by Dr. Vicente Males which was normal and a repeat colonoscopy was recommended in 10 years.  Since the diagnosis of iron deficiency patient has been taking oral iron tablets once a day.  She denies any consistent use of NSAIDs or Goody powder/BC powder.  She still gets her menstrual cycles but has been getting it less frequently.  They can be sometimes heavy and she does see GYN for this as well.  Interval history menstrual cycles are irregular and not as heavy. Patient feels she is perimenopausal at this time. Energy levels are stable. Denies any blood loss in stool or urine   Review of Systems  Constitutional:  Negative for chills, fever, malaise/fatigue and weight loss.  HENT:  Negative for congestion, ear discharge and nosebleeds.   Eyes:  Negative for blurred vision.   Respiratory:  Negative for cough, hemoptysis, sputum production, shortness of breath and wheezing.   Cardiovascular:  Negative for chest pain, palpitations, orthopnea and claudication.  Gastrointestinal:  Negative for abdominal pain, blood in stool, constipation, diarrhea, heartburn, melena, nausea and vomiting.  Genitourinary:  Negative for dysuria, flank pain, frequency, hematuria and urgency.  Musculoskeletal:  Negative for back pain, joint pain and myalgias.  Skin:  Negative for rash.  Neurological:  Negative for dizziness, tingling, focal weakness, seizures, weakness and headaches.  Endo/Heme/Allergies:  Does not bruise/bleed easily.  Psychiatric/Behavioral:  Negative for depression and suicidal ideas. The patient does not have insomnia.    Allergies  Allergen Reactions   Shellfish Allergy     Past Medical History:  Diagnosis Date   Anemia    Thyroid disease     Past Surgical History:  Procedure Laterality Date   BRAIN TUMOR EXCISION  2007,2010   COLONOSCOPY WITH PROPOFOL N/A 10/12/2018   Procedure: COLONOSCOPY WITH PROPOFOL;  Surgeon: Jonathon Bellows, MD;  Location: [facility] ENDOSCOPY;  Service: Gastroenterology;  Laterality: N/A;    Social History   Socioeconomic History   Marital status: Single    Spouse name: Not on file   Number of children: Not on file   Years of education: Not on file   Highest education level: Not on file  Occupational History   Not on file  Tobacco Use   Smoking status: Never   Smokeless tobacco: Never  Vaping Use   Vaping Use: Never used  Substance and Sexual Activity   Alcohol use: No  Alcohol/week: 0.0 standard drinks   Drug use: No   Sexual activity: Never  Other Topics Concern   Not on file  Social History Narrative   Not on file   Social Determinants of Health   Financial Resource Strain: Not on file  Food Insecurity: Not on file  Transportation Needs: Not on file  Physical Activity: Not on file  Stress: Not on file  Social  Connections: Not on file  Intimate Partner Violence: Not on file    Family History  Problem Relation Age of Onset   Alcohol abuse Mother    CVA Father    CAD Father    Hypertension Father    Heart attack Father    Healthy Brother      Current Outpatient Medications:    ferrous sulfate 325 (65 FE) MG tablet, Take 2 tablets by mouth daily., Disp: , Rfl:    levothyroxine (SYNTHROID) 50 MCG tablet, TAKE 1 TABLET BY MOUTH ONCE DAILY BEFORE BREAKFAST, Disp: 90 tablet, Rfl: 3   Multiple Vitamin (MULTIVITAMIN) tablet, Take 1 tablet by mouth daily., Disp: , Rfl:    OMEGA-3 FATTY ACIDS PO, 1 tablet daily., Disp: , Rfl:    Vitamin D, Cholecalciferol, 400 UNITS TABS, Take 1 tablet by mouth daily., Disp: , Rfl:   No results found.  No images are attached to the encounter.   CMP Latest Ref Rng & Units 09/04/2020  Glucose 65 - 99 mg/dL 76  BUN 6 - 24 mg/dL 7  Creatinine 0.57 - 1.00 mg/dL 0.83  Sodium 134 - 144 mmol/L 139  Potassium 3.5 - 5.2 mmol/L 4.2  Chloride 96 - 106 mmol/L 103  CO2 20 - 29 mmol/L 23  Calcium 8.7 - 10.2 mg/dL 10.0  Total Protein 6.0 - 8.5 g/dL 7.0  Total Bilirubin 0.0 - 1.2 mg/dL 0.2  Alkaline Phos 44 - 121 IU/L 54  AST 0 - 40 IU/L 24  ALT 0 - 32 IU/L 20   CBC Latest Ref Rng & Units 07/30/2021  WBC 4.0 - 10.5 K/uL 3.8(L)  Hemoglobin 12.0 - 15.0 g/dL 13.1  Hematocrit 36.0 - 46.0 % 39.2  Platelets 150 - 400 K/uL 309    Assessment and plan: Patient is a 55 year old female with history of iron deficiency anemia and this is a routine follow-up visit  Patient has not required any IV iron so far.  She is not presently anemic with a hemoglobin of 13.1.  Ferritin levels remain normal at 91 with an iron saturation of 20%.  She does not require any IV iron at this time.  Given the stability of her hemoglobin she can continue to follow-up with her primary care provider at this time.  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 The patient was provided an opportunity to ask questions and all were answered. The patient agreed with the plan and demonstrated an understanding of the instructions.   The patient was advised to call back or seek an in-person evaluation if the symptoms worsen or if the condition fails to improve as anticipated.  I provided 12 minutes of non face-to-face telephone visit time during this encounter, and > 50% was spent counseling as documented under my assessment & plan.  Visit Diagnosis: 1. Iron  deficiency anemia, unspecified iron deficiency anemia type     Dr. Randa Evens, MD, MPH [facility] at [facility] Tel- [phone] 07/30/2021 7:40 PM

## 2021-08-09 LAB — CBC WITH DIFFERENTIAL/PLATELET
Basophils Absolute: 0 10*3/uL (ref 0.0–0.2)
Basos: 1 %
EOS (ABSOLUTE): 0 10*3/uL (ref 0.0–0.4)
Eos: 1 %
Hematocrit: 38 % (ref 34.0–46.6)
Hemoglobin: 13.1 g/dL (ref 11.1–15.9)
Immature Grans (Abs): 0 10*3/uL (ref 0.0–0.1)
Immature Granulocytes: 1 %
Lymphocytes Absolute: 1.1 10*3/uL (ref 0.7–3.1)
Lymphs: 27 %
MCH: 31.7 pg (ref 26.6–33.0)
MCHC: 34.5 g/dL (ref 31.5–35.7)
MCV: 92 fL (ref 79–97)
Monocytes Absolute: 0.3 10*3/uL (ref 0.1–0.9)
Monocytes: 8 %
Neutrophils Absolute: 2.6 10*3/uL (ref 1.4–7.0)
Neutrophils: 62 %
Platelets: 310 10*3/uL (ref 150–450)
RBC: 4.13 x10E6/uL (ref 3.77–5.28)
RDW: 11.5 % — ABNORMAL LOW (ref 11.7–15.4)
WBC: 4.1 10*3/uL (ref 3.4–10.8)

## 2021-08-09 LAB — COMPREHENSIVE METABOLIC PANEL
ALT: 28 IU/L (ref 0–32)
AST: 27 IU/L (ref 0–40)
Albumin/Globulin Ratio: 2 (ref 1.2–2.2)
Albumin: 5 g/dL — ABNORMAL HIGH (ref 3.8–4.9)
Alkaline Phosphatase: 85 IU/L (ref 44–121)
BUN/Creatinine Ratio: 11 (ref 9–23)
BUN: 10 mg/dL (ref 6–24)
Bilirubin Total: <0.2 mg/dL (ref 0.0–1.2)
CO2: 24 mmol/L (ref 20–29)
Calcium: 10.4 mg/dL — ABNORMAL HIGH (ref 8.7–10.2)
Chloride: 102 mmol/L (ref 96–106)
Creatinine, Ser: 0.87 mg/dL (ref 0.57–1.00)
Globulin, Total: 2.5 g/dL (ref 1.5–4.5)
Glucose: 80 mg/dL (ref 65–99)
Potassium: 4.4 mmol/L (ref 3.5–5.2)
Sodium: 142 mmol/L (ref 134–144)
Total Protein: 7.5 g/dL (ref 6.0–8.5)
eGFR: 80 mL/min/{1.73_m2} (ref >59)

## 2021-08-09 LAB — T4: T4, Total: 7.1 ug/dL (ref 4.5–12.0)

## 2021-08-09 LAB — TSH: TSH: 13.8 u[IU]/mL — ABNORMAL HIGH (ref 0.450–4.500)

## 2021-09-07 ENCOUNTER — Encounter: Payer: Self-pay | Admitting: Physician Assistant

## 2021-09-07 ENCOUNTER — Other Ambulatory Visit: Payer: Self-pay

## 2021-09-07 ENCOUNTER — Ambulatory Visit (INDEPENDENT_AMBULATORY_CARE_PROVIDER_SITE_OTHER): Payer: 59 | Admitting: Family Medicine

## 2021-09-07 ENCOUNTER — Encounter: Payer: Self-pay | Admitting: Family Medicine

## 2021-09-07 VITALS — BP 128/83 | HR 81 | Temp 99.0°F | Resp 16 | Ht 64.0 in | Wt 155.6 lb

## 2021-09-07 DIAGNOSIS — Z1231 Encounter for screening mammogram for malignant neoplasm of breast: Secondary | ICD-10-CM | POA: Diagnosis not present

## 2021-09-07 DIAGNOSIS — E039 Hypothyroidism, unspecified: Secondary | ICD-10-CM | POA: Diagnosis not present

## 2021-09-07 DIAGNOSIS — Z131 Encounter for screening for diabetes mellitus: Secondary | ICD-10-CM

## 2021-09-07 DIAGNOSIS — Z23 Encounter for immunization: Secondary | ICD-10-CM

## 2021-09-07 DIAGNOSIS — Z136 Encounter for screening for cardiovascular disorders: Secondary | ICD-10-CM

## 2021-09-07 DIAGNOSIS — Z13220 Encounter for screening for lipoid disorders: Secondary | ICD-10-CM

## 2021-09-07 DIAGNOSIS — R635 Abnormal weight gain: Secondary | ICD-10-CM

## 2021-09-07 DIAGNOSIS — R5382 Chronic fatigue, unspecified: Secondary | ICD-10-CM

## 2021-09-07 MED ORDER — FERROUS SULFATE 325 (65 FE) MG PO TABS: 325.0000 mg | ORAL_TABLET | Freq: Every day | ORAL | Status: DC

## 2021-09-07 NOTE — Progress Notes (Signed)
Complete physical exam   Patient: Karen Randolph   DOB: 09-10-1966   55 y.o. Female  MRN: [id number] Visit Date: 09/07/2021  Today's healthcare provider: Gwyneth Sprout, FNP   Chief Complaint  Patient presents with   Annual Exam   Subjective     HPI  KAYLENN CIVIL is a 55 y.o. female who presents today for a complete physical exam.  She reports consuming a general diet. Home exercise routine includes walking. She generally feels well. She reports sleeping well. She does not have additional problems to discuss today.  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   Thyroid disease    Past Surgical History:  Procedure Laterality Date   BRAIN TUMOR EXCISION  2007,2010   COLONOSCOPY WITH PROPOFOL N/A 10/12/2018   Procedure: COLONOSCOPY WITH PROPOFOL;  Surgeon: Jonathon Bellows, MD;  Location: [facility] ENDOSCOPY;  Service: Gastroenterology;  Laterality: N/A;   Social History   Socioeconomic History   Marital status: Single    Spouse name: Not on file   Number of children: Not on file   Years of education: Not on file   Highest education level: Not on file  Occupational History   Not on file  Tobacco Use   Smoking status: Never   Smokeless tobacco: Never  Vaping Use   Vaping Use: Never used  Substance and Sexual Activity   Alcohol use: No    Alcohol/week: 0.0 standard drinks   Drug use: No   Sexual activity: Never  Other Topics Concern   Not on file  Social History Narrative   Not on file   Social Determinants of Health   Financial Resource Strain: Not on file  Food Insecurity: Not on file  Transportation Needs: Not on file  Physical Activity: Not on file  Stress: Not on file  Social Connections: Not on file  Intimate Partner Violence: Not on file   Family Status  Relation Name Status   Mother  Deceased at age 34   Father   Deceased at age 90   MGM  Deceased   MGF  Deceased   PGM  Deceased   PGF  Deceased   Brother  (Not Specified)   Family History  Problem Relation Age of Onset   Alcohol abuse Mother    CVA Father    CAD Father    Hypertension Father    Heart attack Father    Healthy Brother    Allergies  Allergen Reactions   Shellfish Allergy     Patient Care Team: Gwyneth Sprout, FNP as PCP - General (Family Medicine) Sindy Guadeloupe, MD as Consulting Physician (Hematology and Oncology)   Medications: Outpatient Medications Prior to Visit  Medication Sig   levothyroxine (SYNTHROID) 50 MCG tablet TAKE 1 TABLET BY MOUTH ONCE DAILY BEFORE BREAKFAST   Multiple Vitamin (MULTIVITAMIN) tablet Take 1 tablet by mouth daily.   OMEGA-3 FATTY ACIDS PO 1 tablet daily.   Vitamin D, Cholecalciferol, 400 UNITS TABS Take 1 tablet by mouth daily.   [DISCONTINUED] ferrous sulfate 325 (65 FE) MG tablet Take 2 tablets by mouth daily.   No facility-administered medications prior to visit.    Review of Systems  All other systems reviewed and are negative.    Objective    BP 128/83   Pulse 81   Temp 99 F (37.2 C) (Oral)  Resp 16   Ht 5\' 4"  (1.626 m)   Wt 155 lb 9.6 oz (70.6 kg)   SpO2 99%   BMI 26.71 kg/m    Physical Exam Vitals and nursing note reviewed.  Constitutional:      General: She is awake. She is not in acute distress.    Appearance: Normal appearance. She is well-developed, well-groomed and overweight. She is not ill-appearing, toxic-appearing or diaphoretic.  HENT:     Head: Normocephalic and atraumatic.     Jaw: There is normal jaw occlusion. No trismus, tenderness, swelling or pain on movement.     Right Ear: Hearing, tympanic membrane, ear canal and external ear normal. There is no impacted cerumen.     Left Ear: Hearing, tympanic membrane, ear canal and external ear normal. There is no impacted cerumen.     Nose: Nose normal. No congestion or rhinorrhea.     Right Turbinates:  Not enlarged, swollen or pale.     Left Turbinates: Not enlarged, swollen or pale.     Right Sinus: No maxillary sinus tenderness or frontal sinus tenderness.     Left Sinus: No maxillary sinus tenderness or frontal sinus tenderness.     Mouth/Throat:     Lips: Pink.     Mouth: Mucous membranes are moist. No injury.     Tongue: No lesions.     Pharynx: Oropharynx is clear. Uvula midline. No pharyngeal swelling, oropharyngeal exudate, posterior oropharyngeal erythema or uvula swelling.     Tonsils: No tonsillar exudate or tonsillar abscesses.  Eyes:     General: Lids are normal. Lids are everted, no foreign bodies appreciated. Vision grossly intact. Gaze aligned appropriately. No allergic shiner or visual field deficit.       Right eye: No discharge.        Left eye: No discharge.     Extraocular Movements: Extraocular movements intact.     Conjunctiva/sclera: Conjunctivae normal.     Right eye: Right conjunctiva is not injected. No exudate.    Left eye: Left conjunctiva is not injected. No exudate.    Pupils: Pupils are equal, round, and reactive to light.  Neck:     Thyroid: No thyroid mass, thyromegaly or thyroid tenderness.     Vascular: No carotid bruit.     Trachea: Trachea normal.  Cardiovascular:     Rate and Rhythm: Normal rate and regular rhythm.     Pulses: Normal pulses.          Carotid pulses are 2+ on the right side and 2+ on the left side.      Radial pulses are 2+ on the right side and 2+ on the left side.       Dorsalis pedis pulses are 2+ on the right side and 2+ on the left side.       Posterior tibial pulses are 2+ on the right side and 2+ on the left side.     Heart sounds: Normal heart sounds, S1 normal and S2 normal. No murmur heard.   No friction rub. No gallop.  Pulmonary:     Effort: Pulmonary effort is normal. No respiratory distress.     Breath sounds: Normal breath sounds and air entry. No stridor. No wheezing, rhonchi or rales.  Chest:     Chest wall:  No tenderness.       Comments: Breast exam normal with exception of small scabbing noted on R breast above nipple line; discussed 'know your lemons' campaign and self exam  Abdominal:     General: Abdomen is flat. Bowel sounds are normal. There is no distension.     Palpations: Abdomen is soft. There is no mass.     Tenderness: There is no abdominal tenderness. There is no right CVA tenderness, left CVA tenderness, guarding or rebound.     Hernia: No hernia is present.  Genitourinary:    Comments: Exam deferred; denies complaints Musculoskeletal:        General: No swelling, tenderness, deformity or signs of injury. Normal range of motion.     Cervical back: Full passive range of motion without pain, normal range of motion and neck supple. No edema, rigidity or tenderness. No muscular tenderness.     Right lower leg: No edema.     Left lower leg: No edema.  Lymphadenopathy:     Cervical: No cervical adenopathy.     Right cervical: No superficial, deep or posterior cervical adenopathy.    Left cervical: No superficial, deep or posterior cervical adenopathy.  Skin:    General: Skin is warm and dry.     Capillary Refill: Capillary refill takes less than 2 seconds.     Coloration: Skin is not jaundiced or pale.     Findings: No bruising, erythema, lesion or rash.  Neurological:     General: No focal deficit present.     Mental Status: She is alert and oriented to person, place, and time. Mental status is at baseline.     GCS: GCS eye subscore is 4. GCS verbal subscore is 5. GCS motor subscore is 6.     Sensory: Sensation is intact. No sensory deficit.     Motor: Motor function is intact. No weakness.     Coordination: Coordination is intact. Coordination normal.     Gait: Gait is intact. Gait normal.  Psychiatric:        Attention and Perception: Attention and perception normal.        Mood and Affect: Mood and affect normal.        Speech: Speech normal.        Behavior: Behavior  normal. Behavior is cooperative.        Thought Content: Thought content normal.        Cognition and Memory: Cognition and memory normal.        Judgment: Judgment normal.     Last depression screening scores PHQ 2/9 Scores 09/07/2021 06/22/2021 09/04/2020  PHQ - 2 Score 0 0 0  PHQ- 9 Score 0 0 -   Last fall risk screening Fall Risk  06/22/2021  Falls in the past year? 0  Number falls in past yr: 0  Injury with Fall? 0  Risk for fall due to : No Fall Risks  Follow up Falls evaluation completed   Last Audit-C alcohol use screening Alcohol Use Disorder Test (AUDIT) 09/07/2021  1. How often do you have a drink containing alcohol? 0  2. How many drinks containing alcohol do you have on a typical day when you are drinking? 0  3. How often do you have six or more drinks on one occasion? 0  AUDIT-C Score 0  Alcohol Brief Interventions/Follow-up -   A score of 3 or more in women, and 4 or more in men indicates increased risk for alcohol abuse, EXCEPT if all of the points are from question 1   No results found for any visits on 09/07/21.  Assessment & Plan    Routine Health Maintenance and Physical Exam  Exercise Activities and Dietary recommendations  Goals   None     Immunization History  Administered Date(s) Administered   Influenza,inj,Quad PF,6+ Mos 10/10/2015, 08/04/2017, 08/27/2018, 08/30/2019, 09/04/2020, 09/07/2021   PFIZER(Purple Top)SARS-COV-2 Vaccination 07/26/2020, 08/16/2020   Tdap 04/07/2008, 02/20/2018   Zoster Recombinat (Shingrix) 09/04/2020, 11/06/2020    Health Maintenance  Topic Date Due   COVID-19 Vaccine (3 - Booster for Pfizer series) 01/16/2021   MAMMOGRAM  08/28/2022   PAP SMEAR-Modifier  08/28/2023   TETANUS/TDAP  02/21/2028   COLONOSCOPY (Pts 45-51yrs Insurance coverage will need to be confirmed)  10/12/2028   INFLUENZA VACCINE  Completed   Hepatitis C Screening  Completed   HIV Screening  Completed   Zoster Vaccines- Shingrix  Completed   HPV  VACCINES  Aged Out    Discussed health benefits of physical activity, and encouraged her to engage in regular exercise appropriate for her age and condition.  Problem List Items Addressed This Visit       Endocrine   Hypothyroidism    Repeat of lab work Slow weight gain- 30# Slight decrease in walking Samples given of 14 day supply of higher dose, with lab work results pending      Relevant Orders   TSH + free T4     Other   Encounter for screening mammogram for malignant neoplasm of breast - Primary    Normal exam discussed known your lemons Small skin breakdown noted on R upper breast Encouraged to f/u with derm if non healing or RTC Mammogram to be scheduled      Relevant Orders   MM DIGITAL SCREENING BILATERAL   Need for influenza vaccination    Given today      Relevant Orders   Flu Vaccine QUAD 35mo+IM (Fluarix, Fluzone & Alfiuria Quad PF) (Completed)   Weight gain    30# weight gain per pt report Known change in thyroid levels Repeat today      Other Visit Diagnoses     Diabetes mellitus screening       Relevant Orders   Hemoglobin A1c   Chronic fatigue       Relevant Orders   Iron, TIBC and Ferritin Panel   Encounter for lipid screening for cardiovascular disease       Relevant Orders   Lipid panel        Return in about 3 months (around 12/07/2021), or thyroid medication, for chonic disease management.    Vonna Kotyk, FNP, have reviewed all documentation for this visit. The documentation on 09/07/21 for the exam, diagnosis, procedures, and orders are all accurate and complete.    Gwyneth Sprout, [place] [phone] (phone) [phone] (fax)  [place]

## 2021-09-07 NOTE — Assessment & Plan Note (Signed)
Normal exam discussed known your lemons Small skin breakdown noted on R upper breast Encouraged to f/u with derm if non healing or RTC Mammogram to be scheduled

## 2021-09-07 NOTE — Assessment & Plan Note (Signed)
Repeat of lab work Slow weight gain- 30# Slight decrease in walking Samples given of 14 day supply of higher dose, with lab work results pending

## 2021-09-07 NOTE — Assessment & Plan Note (Signed)
Given today.

## 2021-09-07 NOTE — Assessment & Plan Note (Signed)
30# weight gain per pt report Known change in thyroid levels Repeat today

## 2021-09-12 ENCOUNTER — Other Ambulatory Visit: Payer: Self-pay | Admitting: Family Medicine

## 2021-09-12 DIAGNOSIS — E039 Hypothyroidism, unspecified: Secondary | ICD-10-CM

## 2021-09-12 LAB — LIPID PANEL
Chol/HDL Ratio: 2.2 ratio (ref 0.0–4.4)
Cholesterol, Total: 269 mg/dL — ABNORMAL HIGH (ref 100–199)
HDL: 123 mg/dL (ref >39)
LDL Chol Calc (NIH): 134 mg/dL — ABNORMAL HIGH (ref 0–99)
Triglycerides: 75 mg/dL (ref 0–149)
VLDL Cholesterol Cal: 12 mg/dL (ref 5–40)

## 2021-09-12 LAB — IRON,TIBC AND FERRITIN PANEL
Ferritin: 126 ng/mL (ref 15–150)
Iron Saturation: 28 % (ref 15–55)
Iron: 82 ug/dL (ref 27–159)
Total Iron Binding Capacity: 289 ug/dL (ref 250–450)
UIBC: 207 ug/dL (ref 131–425)

## 2021-09-12 LAB — TSH+FREE T4
Free T4: 0.94 ng/dL (ref 0.82–1.77)
TSH: 14.5 u[IU]/mL — ABNORMAL HIGH (ref 0.450–4.500)

## 2021-09-12 LAB — HEMOGLOBIN A1C
Est. average glucose Bld gHb Est-mCnc: 108 mg/dL
Hgb A1c MFr Bld: 5.4 % (ref 4.8–5.6)

## 2021-09-18 ENCOUNTER — Other Ambulatory Visit: Payer: Self-pay | Admitting: Family Medicine

## 2021-09-18 NOTE — Telephone Encounter (Signed)
Pt was seen on 9.30.22 and her RX for ferrous sulfate 325 (65 FE) MG tablet was never received by the pharmacy / the class says NO PRINT/ please resend today if possible and advise pt when sent to [facility] on [address]

## 2021-09-19 MED ORDER — LEVOTHYROXINE SODIUM 75 MCG PO TABS: 75.0000 ug | ORAL_TABLET | Freq: Every day | ORAL | 0 refills | Status: DC

## 2021-09-19 MED ORDER — FERROUS SULFATE 325 (65 FE) MG PO TABS: 325.0000 mg | ORAL_TABLET | Freq: Every day | ORAL | 0 refills | Status: AC

## 2021-09-20 ENCOUNTER — Encounter: Payer: Self-pay | Admitting: Family Medicine

## 2021-09-20 ENCOUNTER — Other Ambulatory Visit: Payer: Self-pay | Admitting: Family Medicine

## 2021-09-20 ENCOUNTER — Telehealth: Payer: Self-pay

## 2021-09-20 DIAGNOSIS — E039 Hypothyroidism, unspecified: Secondary | ICD-10-CM

## 2021-09-20 NOTE — Telephone Encounter (Signed)
Levothyroxine ( synthroid) 52mcg was sent to patients pharmacy yesterday. Patient states that pharmacist gave her Euthyrox and advised her that this brand will be discontinued, patient states that she was previously on Euthyrox for her thyroid and wants to continue on Euthyrox. See nurse message below [place]. KW

## 2021-09-20 NOTE — Telephone Encounter (Signed)
Copied from [place] [phone]. Topic: Quick Communication - Rx Refill/Question >> Sep 20, 2021  8:16 AM [name] wrote: Medication: levothyroxine (SYNTHROID) 75 MCG tablet 90 tablet 0 09/19/2021   Sig - Route: Take 1 tablet (75 mcg total) by mouth daily before breakfast. - Oral  Sent to pharmacy as: levothyroxine (SYNTHROID) 75 MCG tablet  E-Prescribing Status: Receipt confirmed by pharmacy (09/19/2021 10:26 AM EDT)   Pt had a refill yesterday on her Euthyrox Thyroid medication. Instead she received the above, the dosage was also increased.Pt was told that the brand was going away and wants to stay on the Euthyrox. She does not want the dosage increased if possible. She does not want to be on the brand as she is repeatedly saying she was told to not change brands on thyroid medication, She is concerned. She wants to talk to [facility] or her nurse today is at all possible.  The pharmacist also had a conversation with her and may also be some misunderstanding. Please advise pt . [phone].  Has the patient contacted their pharmacy? Yes.   (Agent: If no, request that the patient contact the pharmacy for the refill.) (Agent: If yes, when and what did the pharmacy advise?) call dr  Preferred Pharmacy (with phone number or street name): [place], [place] - [address] [place] [address] Phone: [phone] Fax: [phone] Hours: Not open 24 hours   Has the patient been seen for an appointment in the last year OR does the patient have an upcoming appointment? Yes.    Agent: Please be advised that RX refills may take up to 3 business days. We ask that you follow-up with your pharmacy.

## 2021-10-09 ENCOUNTER — Telehealth: Payer: Self-pay

## 2021-10-09 NOTE — Telephone Encounter (Signed)
Patient came by the office asking for "Euthyrox tab 75 mcg" to be sent into [facility] mail service  instead of taking the Levothyroxine.

## 2021-10-09 NOTE — Telephone Encounter (Signed)
Please review request, according to my telephone encounter with patient on 09/25/21 she stated in message that pharmacist had explained to her that Euthryox brand would be d/c. Patient is now requesting this for mail order, have you heard if Euthryox is being d/c? KW

## 2021-10-10 NOTE — Telephone Encounter (Signed)
I have not heard about that. I checked with the pharmacists with [facility] and they are still able to order Euthyrox in all strengths.   Junius Argyle, PharmD, [place], [place] [phone]

## 2021-10-10 NOTE — Telephone Encounter (Signed)
Okay for me to put in refill for this brandand send to pharmacy? KW

## 2021-10-11 MED ORDER — LEVOTHYROXINE SODIUM 75 MCG PO TABS: 75.0000 ug | ORAL_TABLET | Freq: Every day | ORAL | 0 refills | Status: DC

## 2021-12-15 IMAGING — MG DIGITAL SCREENING BILAT W/ TOMO W/ CAD
8 series · 8 of 24 positions shown · non-contrast
Comparison: Previous exam(s).

CLINICAL DATA: Screening.

EXAM:
DIGITAL SCREENING BILATERAL MAMMOGRAM WITH TOMO AND CAD

[R MLO synth-2D]
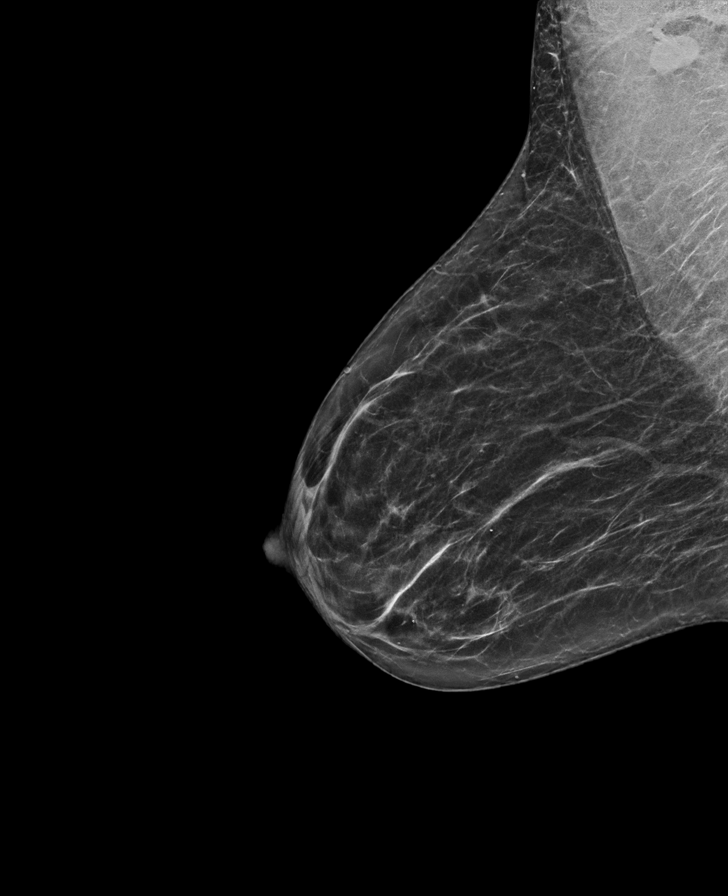

[R CC synth-2D]
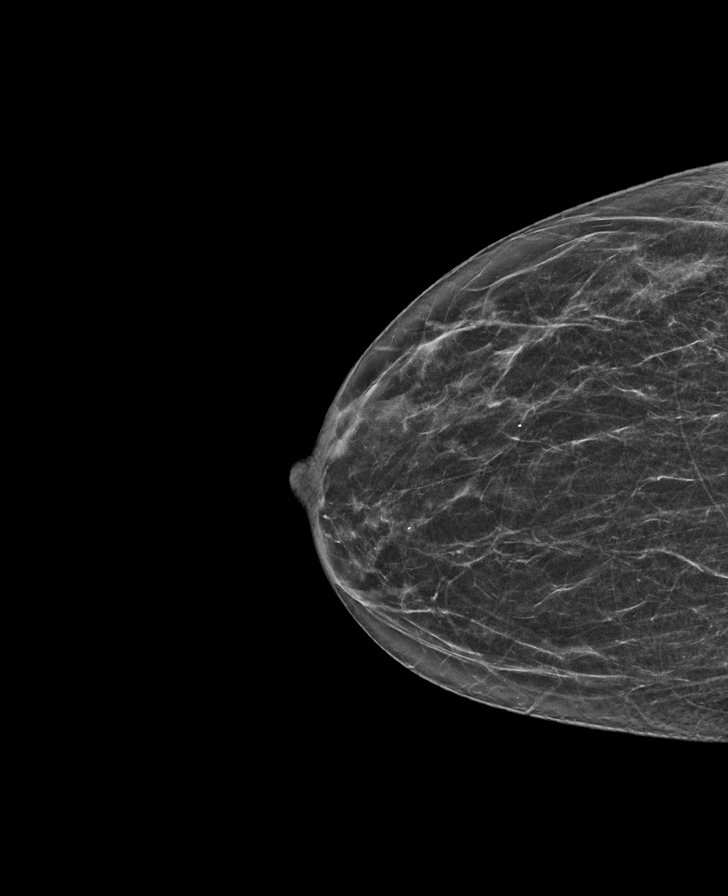

[L MLO synth-2D]
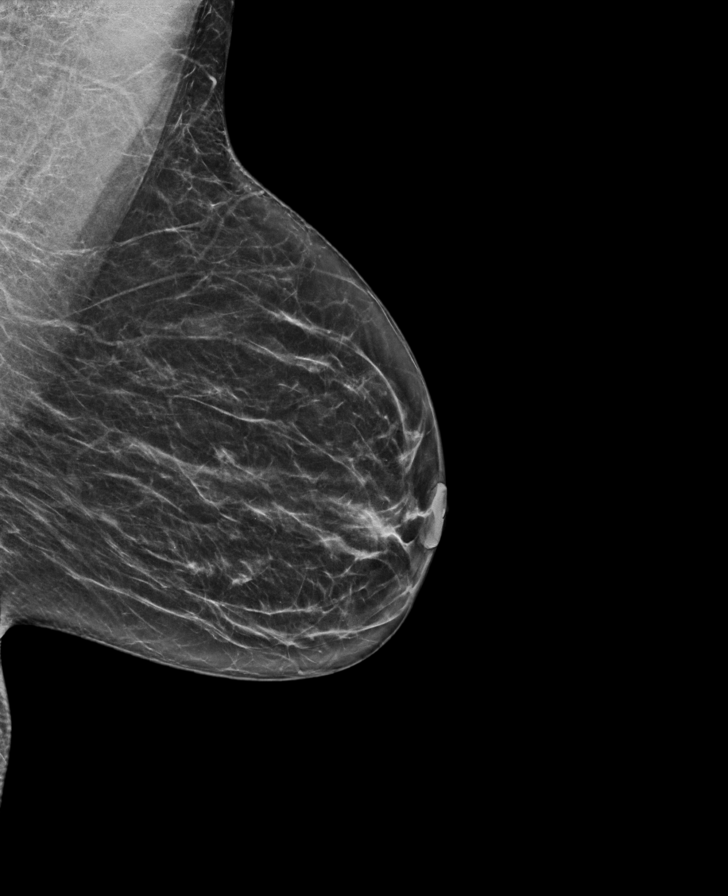

[L CC synth-2D]
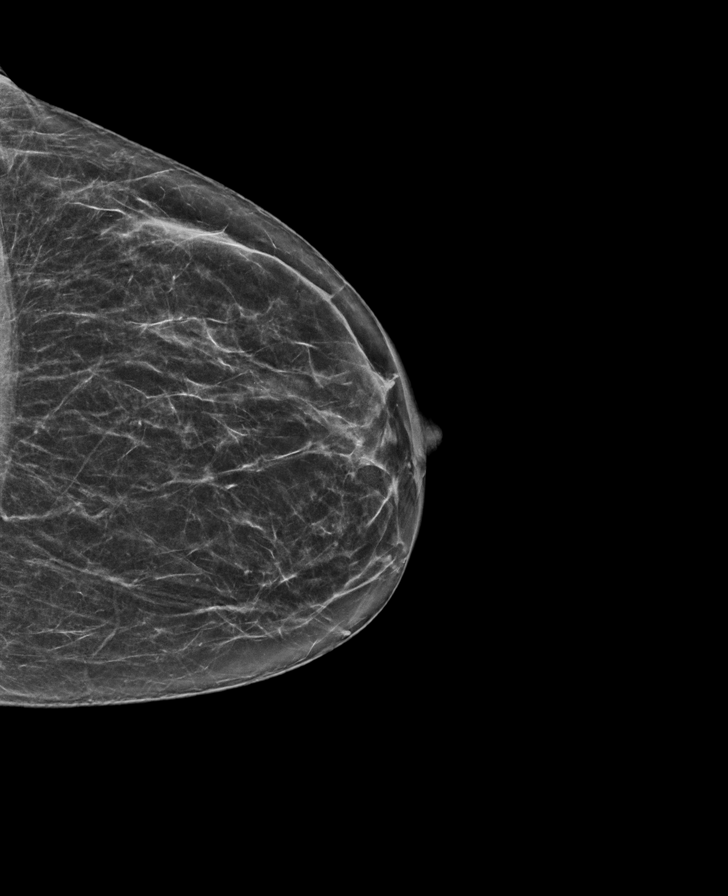

[L CC tomo · tomo slice 31/61.0]
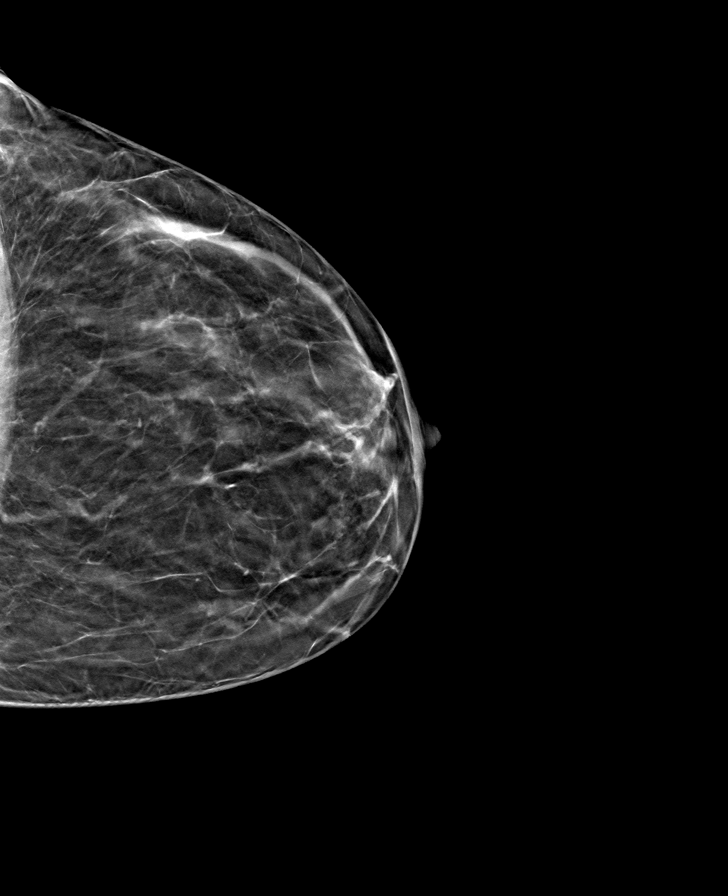

[L MLO tomo · tomo slice 33/64.0]
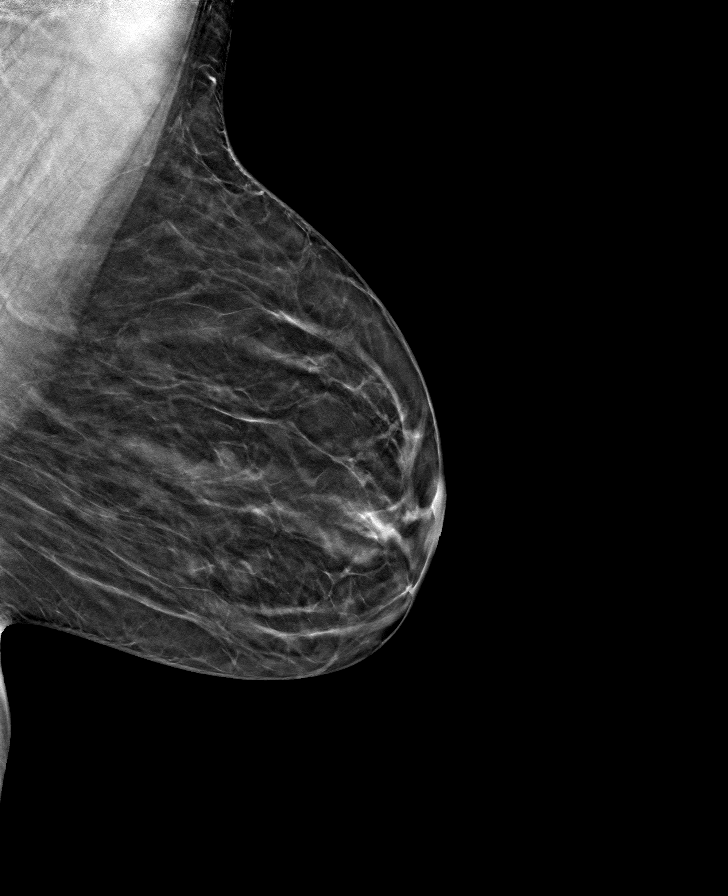

[R CC tomo · tomo slice 27/53.0]
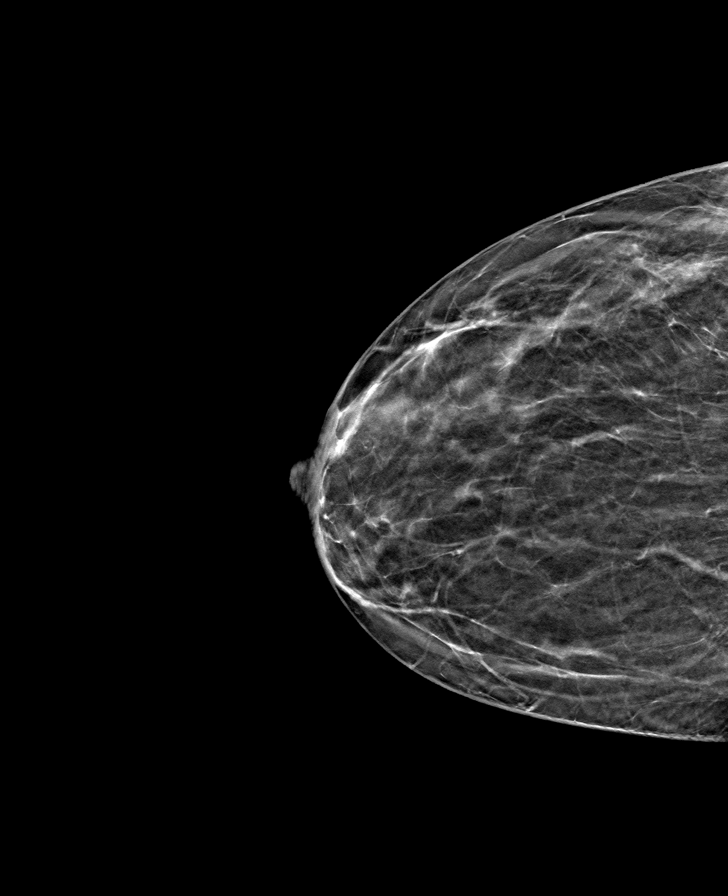

[R MLO tomo · tomo slice 33/64.0]
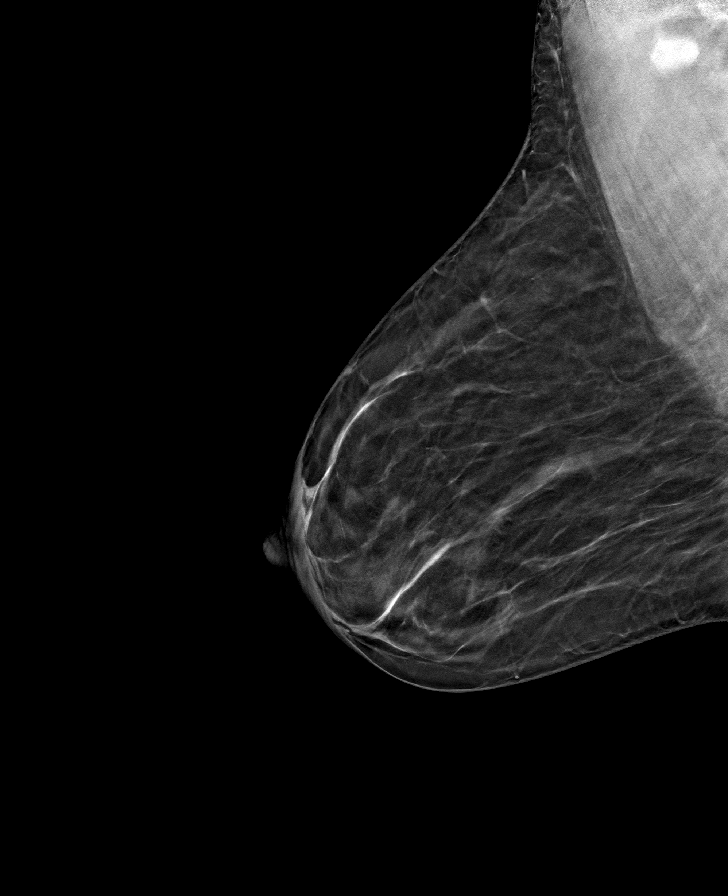

[8 of 24 positions shown; findings below may reference images not displayed]

ACR Breast Density Category b: There are scattered areas of
fibroglandular density.
FINDINGS: There are no findings suspicious for malignancy. Images were
processed with CAD.
IMPRESSION: No mammographic evidence of malignancy. A result letter of this
screening mammogram will be mailed directly to the patient.

RECOMMENDATION:
Screening mammogram in one year. (Code:[id number])

BI-RADS CATEGORY  1: Negative.

## 2022-01-21 ENCOUNTER — Telehealth: Payer: Self-pay

## 2022-01-21 NOTE — Telephone Encounter (Signed)
Patient needs refills of Euthyrox 75 mcg.  Please send to [facility] mail order.

## 2022-01-22 ENCOUNTER — Other Ambulatory Visit: Payer: Self-pay

## 2022-01-22 MED ORDER — LEVOTHYROXINE SODIUM 75 MCG PO TABS: 75.0000 ug | ORAL_TABLET | Freq: Every day | ORAL | 0 refills | Status: DC

## 2022-04-18 ENCOUNTER — Other Ambulatory Visit: Payer: Self-pay | Admitting: Family Medicine

## 2022-04-19 NOTE — Telephone Encounter (Signed)
Requested medication (s) are due for refill today: yes ? ?Requested medication (s) are on the active medication list: yes ? ?Last refill:  01/22/22 #90 ? ?Future visit scheduled: no ? ?Notes to clinic:  TSH last checked 09/07/21-  ? ? ?Requested Prescriptions  ?Pending Prescriptions Disp Refills  ? EUTHYROX 75 MCG tablet [Pharmacy Med Name: EUTHYROX TAB 75MCG] 90 tablet 0  ?  Sig: TAKE 1 TABLET DAILY BEFORE BREAKFAST  ?  ? Endocrinology:  Hypothyroid Agents Failed - 04/18/2022  7:33 PM  ?  ?  Failed - TSH in normal range and within 360 days  ?  TSH  ?Date Value Ref Range Status  ?09/07/2021 14.500 (H) 0.450 - 4.500 uIU/mL Final  ?   ?  ?  Passed - Valid encounter within last 12 months  ?  Recent Outpatient Visits   ? ?      ? 7 months ago Encounter for screening mammogram for malignant neoplasm of breast  ? [facility] Gwyneth Sprout, FNP  ? 10 months ago Swelling of both ankles  ? [place], PA-C  ? 1 year ago Annual physical exam  ? [facility] [place], Clearnce Sorrel, [place]  ? 2 years ago Annual physical exam  ? [facility] [place], [place]ce [place]rel, [place]  ? 3 years ago Annual physical exam  ? [facility] [place], [place], [place]  ? ?  ?  ? ? ?  ?  ?  ? ? ? ? ?

## 2022-04-22 NOTE — Telephone Encounter (Signed)
Message sent to patient through my chart to call the office to schedule follow up appointment. ?

## 2022-06-20 ENCOUNTER — Other Ambulatory Visit: Payer: Self-pay | Admitting: Family Medicine

## 2022-06-21 NOTE — Telephone Encounter (Signed)
Requested medication (s) are due for refill today - yes  Requested medication (s) are on the active medication list -yes  Future visit scheduled -no  Last refill: 04/22/22 #60  Notes to clinic: Attempted to call patient to schedule appointment- left message to call office. Several attempts made for follow up- request sent to office for review   Requested Prescriptions  Pending Prescriptions Disp Refills   EUTHYROX 75 MCG tablet [Pharmacy Med Name: EUTHYROX TAB 75MCG] 60 tablet 0    Sig: TAKE [place]     Endocrinology:  Hypothyroid Agents Failed - 06/20/2022 10:56 PM      Failed - TSH in normal range and within 360 days    TSH  Date Value Ref Range Status  09/07/2021 14.500 (H) 0.450 - 4.500 uIU/mL Final         Passed - Valid encounter within last 12 months    Recent Outpatient Visits           9 months ago Encounter for screening mammogram for malignant neoplasm of breast   [facility] Tally Joe T, FNP   12 months ago Swelling of both ankles   [place], PA-C   1 year ago Annual physical exam   [employer] [facility], Clearnce Sorrel, [place]   2 years ago Annual physical exam   [facility] [place], [place]arn[place] Sorrel, [place]   3 years ago Annual physical exam   [place], Clearnce Sorrel, [place]                 Requested Prescriptions  Pending Prescriptions Disp Refills   [place] 75 MCG tablet [Pharmacy Med Name: EUTHYROX TAB 75MCG] 60 tablet 0    Sig: TAKE 1 [place]     Endocrinology:  Hypothyroid Agents Failed - 06/20/2022 10:56 PM      Failed - TSH in normal range and within 360 days    TSH  Date Value Ref Range Status  09/07/2021 14.500 (H) 0.450 - 4.500 uIU/mL Final         Passed - Valid encounter within last 12 months    Recent Outpatient Visits           9 months ago Encounter for screening mammogram for malignant  neoplasm of breast   [facility] Tally Joe T, FNP   12 months ago Swelling of both ankles   [employer] [facility], Vickki Muff, PA-C   1 year ago Annual physical exam   [facility] [place], Clearnce Sorrel, [place]   2 years ago Annual physical exam   [facility] [place], Clearnce Sorrel, [place]   3 years ago Annual physical exam   [facility] [place], [place], [place]

## 2022-07-01 ENCOUNTER — Telehealth: Payer: Self-pay

## 2022-07-01 NOTE — Telephone Encounter (Signed)
Copied from [place] [id number]. Topic: General - Other >> Jun 28, 2022  4:27 PM Everette C wrote: Reason for CRM: The patient would like to speak with a member of clinical staff regarding their recent refill of levothyroxine (EUTHYROX) 75 MCG tablet [[id number]]   The patient has normally received the medication in 90 day quantities   The patient has recently received a refill that is a 30 day supply   The patient would like to know the reason behind the change in their medication refills  Please contact further when possible

## 2022-07-01 NOTE — Telephone Encounter (Signed)
Patient needs a follow-up appointment. Last office visit was 09/07/2021 with no future appointments. Ok for [facility] to relay message to patient and schedule patient an appointment.

## 2022-07-01 NOTE — Telephone Encounter (Signed)
Scheduled by [place].

## 2022-07-04 ENCOUNTER — Telehealth (INDEPENDENT_AMBULATORY_CARE_PROVIDER_SITE_OTHER): Payer: 59 | Admitting: Family Medicine

## 2022-07-04 ENCOUNTER — Encounter: Payer: Self-pay | Admitting: Family Medicine

## 2022-07-04 DIAGNOSIS — G479 Sleep disorder, unspecified: Secondary | ICD-10-CM | POA: Insufficient documentation

## 2022-07-04 DIAGNOSIS — D509 Iron deficiency anemia, unspecified: Secondary | ICD-10-CM

## 2022-07-04 DIAGNOSIS — E7800 Pure hypercholesterolemia, unspecified: Secondary | ICD-10-CM | POA: Insufficient documentation

## 2022-07-04 DIAGNOSIS — N951 Menopausal and female climacteric states: Secondary | ICD-10-CM | POA: Diagnosis not present

## 2022-07-04 DIAGNOSIS — E663 Overweight: Secondary | ICD-10-CM | POA: Diagnosis not present

## 2022-07-04 DIAGNOSIS — E039 Hypothyroidism, unspecified: Secondary | ICD-10-CM

## 2022-07-04 NOTE — Progress Notes (Signed)
MyChart Video Visit   I,[name] E Rosas,acting as a scribe for Gwyneth Sprout, FNP.,have documented all relevant documentation on the behalf of Gwyneth Sprout, FNP,as directed by  Gwyneth Sprout, FNP while in the presence of Gwyneth Sprout, FNP.   Virtual Visit via Video Note   This visit type was conducted due to national recommendations for restrictions regarding the COVID-19 Pandemic (e.g. social distancing) in an effort to limit this patient's exposure and mitigate transmission in our community. This patient is at least at moderate risk for complications without adequate follow up. This format is felt to be most appropriate for this patient at this time. Physical exam was limited by quality of the video and audio technology used for the visit.   Patient location: home Provider location: [facility] [address]  [address] [place], [place] [address]   I discussed the limitations of evaluation and management by telemedicine and the availability of in person appointments. The patient expressed understanding and agreed to proceed.  Patient: Karen Randolph   DOB: 02/15/1966   56 y.o. Female  MRN: [id number] Visit Date: 07/04/2022  Today's healthcare provider: Gwyneth Sprout, FNP   No chief complaint on file.  Subjective    HPI  Patient is being seen for Thyroid follow-up. Needing refills for medication.   Medications: Outpatient Medications Prior to Visit  Medication Sig   ferrous sulfate 325 (65 FE) MG tablet Take 1 tablet (325 mg total) by mouth daily.   levothyroxine (EUTHYROX) 75 MCG tablet Take 1 tablet (75 mcg total) by mouth daily before breakfast. Please schedule office visit before any future refill. 2nd courtesy refill   Multiple Vitamin (MULTIVITAMIN) tablet Take 1 tablet by mouth daily.   OMEGA-3 FATTY ACIDS PO 1 tablet daily.   Vitamin D, Cholecalciferol, 400 UNITS TABS Take 1 tablet by mouth daily.   No facility-administered medications prior  to visit.    Review of Systems   Objective    There were no vitals taken for this visit.  Physical Exam Pulmonary:     Effort: Pulmonary effort is normal.  Neurological:     General: No focal deficit present.     Mental Status: She is alert and oriented to person, place, and time.  Psychiatric:        Mood and Affect: Mood normal.        Behavior: Behavior normal.        Thought Content: Thought content normal.        Judgment: Judgment normal.        Assessment & Plan     Problem List Items Addressed This Visit       Endocrine   Hypothyroidism    Chronic, previously untreated Needs repeat labs Reports difficulty sleeping and difficulties with weight Currently on 75 mcg/daily      Relevant Orders   TSH+T4F+T3Free     Other   Anemia, iron deficiency    Chronic, stable Use of iron 65 mg supplement Repeat labs      Relevant Orders   CBC with Differential/Platelet   Iron, TIBC and Ferritin Panel   Difficulty sleeping    D/t vasomotor symptoms Check labs      Relevant Orders   Comprehensive Metabolic Panel (CMET)   CBC with Differential/Platelet   Lipid panel   Elevated LDL cholesterol level    Endorses dietary indiscretions recommend diet low in saturated fat and regular exercise - 30 min at least  5 times per week Repeat FLP       Relevant Orders   Comprehensive Metabolic Panel (CMET)   Lipid panel   Overweight (BMI 25.0-29.9)    Last BMI of 26.7 Discussed importance of healthy weight management Discussed diet and exercise       Relevant Orders   Comprehensive Metabolic Panel (CMET)   CBC with Differential/Platelet   TSH+T4F+T3Free   Lipid panel   Vasomotor symptoms due to menopause - Primary    Acute, recurrent Recommend FSH and LH as baseline       Relevant Orders   FSH/LH     No follow-ups on file.     I discussed the assessment and treatment plan with the patient. The patient was provided an opportunity to ask questions  and all were answered. The patient agreed with the plan and demonstrated an understanding of the instructions.   The patient was advised to call back or seek an in-person evaluation if the symptoms worsen or if the condition fails to improve as anticipated.  I provided 5 minutes of face-to-face time during this encounter discussing current symptoms and need for labs.  Vonna Kotyk, FNP, have reviewed all documentation for this visit. The documentation on 07/04/22 for the exam, diagnosis, procedures, and orders are all accurate and complete.   Gwyneth Sprout, [place] [phone] (phone) [phone] (fax)  [place]

## 2022-07-04 NOTE — Assessment & Plan Note (Signed)
Endorses dietary indiscretions recommend diet low in saturated fat and regular exercise - 30 min at least 5 times per week Repeat FLP

## 2022-07-04 NOTE — Assessment & Plan Note (Signed)
Chronic, stable Use of iron 65 mg supplement Repeat labs

## 2022-07-04 NOTE — Assessment & Plan Note (Signed)
Chronic, previously untreated Needs repeat labs Reports difficulty sleeping and difficulties with weight Currently on 75 mcg/daily

## 2022-07-04 NOTE — Assessment & Plan Note (Signed)
Last BMI of 26.7 Discussed importance of healthy weight management Discussed diet and exercise

## 2022-07-04 NOTE — Assessment & Plan Note (Signed)
Acute, recurrent Recommend FSH and LH as baseline

## 2022-07-04 NOTE — Assessment & Plan Note (Signed)
D/t vasomotor symptoms Check labs

## 2022-07-09 ENCOUNTER — Other Ambulatory Visit: Payer: Self-pay | Admitting: Family Medicine

## 2022-07-09 DIAGNOSIS — E039 Hypothyroidism, unspecified: Secondary | ICD-10-CM

## 2022-07-09 LAB — CBC WITH DIFFERENTIAL/PLATELET
Basophils Absolute: 0 10*3/uL (ref 0.0–0.2)
Basos: 1 %
EOS (ABSOLUTE): 0.1 10*3/uL (ref 0.0–0.4)
Eos: 3 %
Hematocrit: 37.5 % (ref 34.0–46.6)
Hemoglobin: 13 g/dL (ref 11.1–15.9)
Immature Grans (Abs): 0 10*3/uL (ref 0.0–0.1)
Immature Granulocytes: 1 %
Lymphocytes Absolute: 1.1 10*3/uL (ref 0.7–3.1)
Lymphs: 36 %
MCH: 31.6 pg (ref 26.6–33.0)
MCHC: 34.7 g/dL (ref 31.5–35.7)
MCV: 91 fL (ref 79–97)
Monocytes Absolute: 0.3 10*3/uL (ref 0.1–0.9)
Monocytes: 10 %
Neutrophils Absolute: 1.5 10*3/uL (ref 1.4–7.0)
Neutrophils: 49 %
Platelets: 241 10*3/uL (ref 150–450)
RBC: 4.12 x10E6/uL (ref 3.77–5.28)
RDW: 12 % (ref 11.7–15.4)
WBC: 3.1 10*3/uL — ABNORMAL LOW (ref 3.4–10.8)

## 2022-07-09 LAB — COMPREHENSIVE METABOLIC PANEL
ALT: 33 IU/L — ABNORMAL HIGH (ref 0–32)
AST: 32 IU/L (ref 0–40)
Albumin/Globulin Ratio: 1.9 (ref 1.2–2.2)
Albumin: 4.4 g/dL (ref 3.8–4.9)
Alkaline Phosphatase: 77 IU/L (ref 44–121)
BUN/Creatinine Ratio: 21 (ref 9–23)
BUN: 18 mg/dL (ref 6–24)
Bilirubin Total: 0.2 mg/dL (ref 0.0–1.2)
CO2: 24 mmol/L (ref 20–29)
Calcium: 9.6 mg/dL (ref 8.7–10.2)
Chloride: 103 mmol/L (ref 96–106)
Creatinine, Ser: 0.85 mg/dL (ref 0.57–1.00)
Globulin, Total: 2.3 g/dL (ref 1.5–4.5)
Glucose: 81 mg/dL (ref 70–99)
Potassium: 4.4 mmol/L (ref 3.5–5.2)
Sodium: 139 mmol/L (ref 134–144)
Total Protein: 6.7 g/dL (ref 6.0–8.5)
eGFR: 81 mL/min/{1.73_m2} (ref >59)

## 2022-07-09 LAB — IRON,TIBC AND FERRITIN PANEL
Ferritin: 165 ng/mL — ABNORMAL HIGH (ref 15–150)
Iron Saturation: 26 % (ref 15–55)
Iron: 77 ug/dL (ref 27–159)
Total Iron Binding Capacity: 296 ug/dL (ref 250–450)
UIBC: 219 ug/dL (ref 131–425)

## 2022-07-09 LAB — FSH/LH
FSH: 90 m[IU]/mL
LH: 48.6 m[IU]/mL

## 2022-07-09 LAB — TSH+T4F+T3FREE
Free T4: 1.38 ng/dL (ref 0.82–1.77)
T3, Free: 2.6 pg/mL (ref 2.0–4.4)
TSH: 2 u[IU]/mL (ref 0.450–4.500)

## 2022-07-09 LAB — LIPID PANEL
Chol/HDL Ratio: 2 ratio (ref 0.0–4.4)
Cholesterol, Total: 241 mg/dL — ABNORMAL HIGH (ref 100–199)
HDL: 122 mg/dL (ref >39)
LDL Chol Calc (NIH): 114 mg/dL — ABNORMAL HIGH (ref 0–99)
Triglycerides: 32 mg/dL (ref 0–149)
VLDL Cholesterol Cal: 5 mg/dL (ref 5–40)

## 2022-07-09 MED ORDER — LEVOTHYROXINE SODIUM 75 MCG PO TABS
75.0000 ug | ORAL_TABLET | Freq: Every day | ORAL | 4 refills | Status: DC
Start: 2022-07-09 — End: 2023-02-03

## 2022-07-09 NOTE — Progress Notes (Signed)
Cholesterol remains elevated above goal of 200 mg/dL. LDL has improved; this is the "bad" cholesterol. However, it also is above goal of 100 mg/dL.  I recommend diet low in saturated fat and regular exercise - 30 min at least 5 times per week. Your good/HDL cholesterol is very high, and we think of this type of cholesterol as "heart protective."  Hormones confirm that you are post-menopausal.  Thyroid is normal. TSH at 2.  Please let us know if you have any questions.  Thank you, Gwyneth Sprout, [address] [place], [place] [address] [phone] (phone) [phone] (fax) [place]

## 2022-09-27 NOTE — Progress Notes (Unsigned)
Complete physical exam   Patient: Karen Randolph   DOB: 09-07-1966   56 y.o. Female  MRN: [id number] Visit Date: 10/03/2022  Today's healthcare provider: Gwyneth Sprout, FNP  Re Introduced to nurse practitioner role and practice setting.  All questions answered.  Discussed provider/patient relationship and expectations.   I,Amirr Achord J Wahneta Derocher,acting as a scribe for Gwyneth Sprout, FNP.,have documented all relevant documentation on the behalf of Gwyneth Sprout, FNP,as directed by  Gwyneth Sprout, FNP while in the presence of Gwyneth Sprout, FNP.   Chief Complaint  Patient presents with   Annual Exam   Subjective    Karen Randolph is a 56 y.o. female who presents today for a complete physical exam.  She reports consuming a general diet. Home exercise routine includes walks 15 miles/week. She generally feels well. She reports sleeping well. She does have additional problems to discuss today. R knee has been aching for a couple months.  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   Thyroid disease    Past Surgical History:  Procedure Laterality Date   BRAIN TUMOR EXCISION  2007,2010   COLONOSCOPY WITH PROPOFOL N/A 10/12/2018   Procedure: COLONOSCOPY WITH PROPOFOL;  Surgeon: Jonathon Bellows, MD;  Location: [facility] ENDOSCOPY;  Service: Gastroenterology;  Laterality: N/A;   Social History   Socioeconomic History   Marital status: Single    Spouse name: Not on file   Number of children: Not on file   Years of education: Not on file   Highest education level: Not on file  Occupational History   Not on file  Tobacco Use   Smoking status: Never   Smokeless tobacco: Never  Vaping Use   Vaping Use: Never used  Substance and Sexual Activity   Alcohol use: No    Alcohol/week: 0.0 standard drinks of alcohol   Drug use: No   Sexual activity: Never  Other Topics Concern   Not on file  Social History Narrative   Not on file   Social  Determinants of Health   Financial Resource Strain: Not on file  Food Insecurity: Not on file  Transportation Needs: Not on file  Physical Activity: Not on file  Stress: Not on file  Social Connections: Not on file  Intimate Partner Violence: Not on file   Family Status  Relation Name Status   Mother  Deceased at age 37   Father  Deceased at age 74   MGM  Deceased   MGF  Deceased   PGM  Deceased   PGF  Deceased   Brother  (Not Specified)   Family History  Problem Relation Age of Onset   Alcohol abuse Mother    CVA Father    CAD Father    Hypertension Father    Heart attack Father    Healthy Brother    Allergies  Allergen Reactions   Shellfish Allergy     Patient Care Team: Gwyneth Sprout, FNP as PCP - General (Family Medicine) Sindy Guadeloupe, MD as Consulting Physician (Hematology and Oncology)   Medications: Outpatient Medications Prior to Visit  Medication Sig   ferrous sulfate 325 (65 FE) MG tablet Take 1 tablet (325 mg total) by mouth daily.   levothyroxine (EUTHYROX) 75 MCG tablet Take 1 tablet (75 mcg total) by mouth daily before breakfast.   Multiple Vitamin (MULTIVITAMIN) tablet Take 1 tablet by mouth daily.  OMEGA-3 FATTY ACIDS PO 1 tablet daily.   Vitamin D, Cholecalciferol, 400 UNITS TABS Take 1 tablet by mouth daily.   No facility-administered medications prior to visit.    Review of Systems   Objective    BP 132/88 (BP Location: Left Arm, Patient Position: Sitting, Cuff Size: Normal)   Pulse 80   Resp 16   Ht '5\' 3"'$  (1.6 m)   Wt 153 lb (69.4 kg)   SpO2 98%   BMI 27.10 kg/m    Physical Exam   Last depression screening scores    10/03/2022    3:07 PM 09/07/2021    2:06 PM 06/22/2021    1:22 PM  PHQ 2/9 Scores  PHQ - 2 Score 0 0 0  PHQ- 9 Score  0 0   Last fall risk screening    10/03/2022    3:07 PM  Fall Risk   Falls in the past year? 0  Number falls in past yr: 0  Injury with Fall? 0  Risk for fall due to : No Fall Risks   Follow up Falls evaluation completed   Last Audit-C alcohol use screening    10/03/2022    3:07 PM  Alcohol Use Disorder Test (AUDIT)  1. How often do you have a drink containing alcohol? 0  2. How many drinks containing alcohol do you have on a typical day when you are drinking? 0  3. How often do you have six or more drinks on one occasion? 0  AUDIT-C Score 0   A score of 3 or more in women, and 4 or more in men indicates increased risk for alcohol abuse, EXCEPT if all of the points are from question 1   No results found for any visits on 10/03/22.  Assessment & Plan    Routine Health Maintenance and Physical Exam  Exercise Activities and Dietary recommendations  Goals   None     Immunization History  Administered Date(s) Administered   Influenza,inj,Quad PF,6+ Mos 10/10/2015, 08/04/2017, 08/27/2018, 08/30/2019, 09/04/2020, 09/07/2021, 10/03/2022   PFIZER(Purple Top)SARS-COV-2 Vaccination 07/26/2020, 08/16/2020   Tdap 04/07/2008, 02/20/2018   Zoster Recombinat (Shingrix) 09/04/2020, 11/06/2020    Health Maintenance  Topic Date Due   MAMMOGRAM  08/28/2022   COVID-19 Vaccine (3 - Pfizer series) 10/19/2022 (Originally 10/11/2020)   PAP SMEAR-Modifier  08/28/2023   TETANUS/TDAP  02/21/2028   COLONOSCOPY (Pts 45-79yr Insurance coverage will need to be confirmed)  10/12/2028   INFLUENZA VACCINE  Completed   Hepatitis C Screening  Completed   HIV Screening  Completed   Zoster Vaccines- Shingrix  Completed   HPV VACCINES  Aged Out    Discussed health benefits of physical activity, and encouraged her to engage in regular exercise appropriate for her age and condition.  Problem List Items Addressed This Visit       Endocrine   Hypothyroidism    Chronic, stable Repeat thyroid panel  Currently on euthyrox 75 mcg       Relevant Orders   TSH + free T4     Musculoskeletal and Integument   Primary osteoarthritis of both knees    Acute, worsening R>L Discussed  changing long walks to consistent walks to prevent overuse Discussed use of topical NSAIDs like voltaren Discussed strength building exercises for surround muscles as well as weight reduction Continue to recommend balanced, lower carb meals. Smaller meal size, adding snacks. Choosing water as drink of choice and increasing purposeful exercise.  Hematopoietic and Hemostatic   Aplastic anemia (HCC)    Chronic, stable Taking Iron 65 FE Repeat CBC       Relevant Orders   CBC with Differential/Platelet   Vitamin D (25 hydroxy)     Other   Annual physical exam - Primary    Due for mammo Things to do to keep yourself healthy  - Exercise at least 30-45 minutes a day, 3-4 days a week.  - Eat a low-fat diet with lots of fruits and vegetables, up to 7-9 servings per day.  - Seatbelts can save your life. Wear them always.  - Smoke detectors on every level of your home, check batteries every year.  - Eye Doctor - have an eye exam every 1-2 years  - Safe sex - if you may be exposed to STDs, use a condom.  - Alcohol -  If you drink, do it moderately, less than 2 drinks per day.  - [place]. Choose someone to speak for you if you are not able.  - Depression is common in our stressful world.If you're feeling down or losing interest in things you normally enjoy, please come in for a visit.  - Violence - If anyone is threatening or hurting you, please call immediately.        Relevant Orders   CBC with Differential/Platelet   Comprehensive Metabolic Panel (CMET)   TSH + free T4   Lipid panel   Avitaminosis D    Chronic, previously stable On 400 IU daily       Relevant Orders   Vitamin D (25 hydroxy)   Elevated LDL cholesterol level    Has been working on exercise- 15 miles/week. Taking Omega 3s Repeat LP to calculate ASCVD risk       Encounter for screening mammogram for malignant neoplasm of breast    Due for screening for mammogram, denies breast  concerns, provided with phone number to call and schedule appointment for mammogram. Encouraged to repeat breast cancer screening every 1-2 years.       Relevant Orders   MM 3D SCREEN BREAST BILATERAL   Need for influenza vaccination    Completed; repeat annually       Relevant Orders   Flu Vaccine QUAD 6+ mos PF IM (Fluarix Quad PF) (Completed)   Screening for diabetes mellitus    A1c for DM check per pt request       Relevant Orders   Hemoglobin A1c   Return in about 1 year (around 10/04/2023) for annual examination.     Vonna Kotyk, FNP, have reviewed all documentation for this visit. The documentation on 10/03/22 for the exam, diagnosis, procedures, and orders are all accurate and complete.  Gwyneth Sprout, [place] [phone] (phone) [phone] (fax)  [place]

## 2022-10-03 ENCOUNTER — Encounter: Payer: Self-pay | Admitting: Family Medicine

## 2022-10-03 ENCOUNTER — Ambulatory Visit (INDEPENDENT_AMBULATORY_CARE_PROVIDER_SITE_OTHER): Payer: 59 | Admitting: Family Medicine

## 2022-10-03 VITALS — BP 132/88 | HR 80 | Resp 16 | Ht 63.0 in | Wt 153.0 lb

## 2022-10-03 DIAGNOSIS — E039 Hypothyroidism, unspecified: Secondary | ICD-10-CM | POA: Diagnosis not present

## 2022-10-03 DIAGNOSIS — Z23 Encounter for immunization: Secondary | ICD-10-CM

## 2022-10-03 DIAGNOSIS — Z0000 Encounter for general adult medical examination without abnormal findings: Secondary | ICD-10-CM | POA: Diagnosis not present

## 2022-10-03 DIAGNOSIS — D619 Aplastic anemia, unspecified: Secondary | ICD-10-CM | POA: Diagnosis not present

## 2022-10-03 DIAGNOSIS — E7800 Pure hypercholesterolemia, unspecified: Secondary | ICD-10-CM | POA: Diagnosis not present

## 2022-10-03 DIAGNOSIS — E559 Vitamin D deficiency, unspecified: Secondary | ICD-10-CM | POA: Diagnosis not present

## 2022-10-03 DIAGNOSIS — Z1231 Encounter for screening mammogram for malignant neoplasm of breast: Secondary | ICD-10-CM

## 2022-10-03 DIAGNOSIS — Z131 Encounter for screening for diabetes mellitus: Secondary | ICD-10-CM | POA: Insufficient documentation

## 2022-10-03 DIAGNOSIS — M170 Bilateral primary osteoarthritis of knee: Secondary | ICD-10-CM | POA: Insufficient documentation

## 2022-10-03 NOTE — Assessment & Plan Note (Signed)
Chronic, stable Taking Iron 65 FE Repeat CBC

## 2022-10-03 NOTE — Assessment & Plan Note (Signed)
Acute, worsening R>L Discussed changing long walks to consistent walks to prevent overuse Discussed use of topical NSAIDs like voltaren Discussed strength building exercises for surround muscles as well as weight reduction Continue to recommend balanced, lower carb meals. Smaller meal size, adding snacks. Choosing water as drink of choice and increasing purposeful exercise.

## 2022-10-03 NOTE — Assessment & Plan Note (Signed)
A1c for DM check per pt request

## 2022-10-03 NOTE — Assessment & Plan Note (Signed)
Due for mammo Things to do to keep yourself healthy  - Exercise at least 30-45 minutes a day, 3-4 days a week.  - Eat a low-fat diet with lots of fruits and vegetables, up to 7-9 servings per day.  - Seatbelts can save your life. Wear them always.  - Smoke detectors on every level of your home, check batteries every year.  - Eye Doctor - have an eye exam every 1-2 years  - Safe sex - if you may be exposed to STDs, use a condom.  - Alcohol -  If you drink, do it moderately, less than 2 drinks per day.  - [place]. Choose someone to speak for you if you are not able.  - Depression is common in our stressful world.If you're feeling down or losing interest in things you normally enjoy, please come in for a visit.  - Violence - If anyone is threatening or hurting you, please call immediately.

## 2022-10-03 NOTE — Assessment & Plan Note (Signed)
Has been working on exercise- 15 miles/week. Taking Omega 3s Repeat LP to calculate ASCVD risk

## 2022-10-03 NOTE — Assessment & Plan Note (Signed)
Chronic, previously stable On 400 IU daily

## 2022-10-03 NOTE — Assessment & Plan Note (Signed)
Due for screening for mammogram, denies breast concerns, provided with phone number to call and schedule appointment for mammogram. Encouraged to repeat breast cancer screening every 1-2 years.  

## 2022-10-03 NOTE — Assessment & Plan Note (Signed)
Completed; repeat annually

## 2022-10-03 NOTE — Patient Instructions (Signed)
Please call and schedule your mammogram:  [facility]  [address], [address] [facility] [place],  [place]  [address] Get Driving Directions Main: [phone]  Sunday:Closed Monday:7:20 AM - 5:00 PM Tuesday:7:20 AM - 5:00 PM Wednesday:7:20 AM - 5:00 PM Thursday:7:20 AM - 5:00 PM Friday:7:20 AM - 4:30 PM Saturday:Closed  

## 2022-10-03 NOTE — Assessment & Plan Note (Signed)
Chronic, stable Repeat thyroid panel  Currently on euthyrox 75 mcg

## 2022-10-04 LAB — TSH+FREE T4
Free T4: 1.25 ng/dL (ref 0.82–1.77)
TSH: 1.85 u[IU]/mL (ref 0.450–4.500)

## 2022-10-04 LAB — COMPREHENSIVE METABOLIC PANEL
ALT: 79 IU/L — ABNORMAL HIGH (ref 0–32)
AST: 52 IU/L — ABNORMAL HIGH (ref 0–40)
Albumin/Globulin Ratio: 1.7 (ref 1.2–2.2)
Albumin: 4.6 g/dL (ref 3.8–4.9)
Alkaline Phosphatase: 87 IU/L (ref 44–121)
BUN/Creatinine Ratio: 18 (ref 9–23)
BUN: 15 mg/dL (ref 6–24)
Bilirubin Total: 0.2 mg/dL (ref 0.0–1.2)
CO2: 25 mmol/L (ref 20–29)
Calcium: 10 mg/dL (ref 8.7–10.2)
Chloride: 101 mmol/L (ref 96–106)
Creatinine, Ser: 0.83 mg/dL (ref 0.57–1.00)
Globulin, Total: 2.7 g/dL (ref 1.5–4.5)
Glucose: 88 mg/dL (ref 70–99)
Potassium: 4.4 mmol/L (ref 3.5–5.2)
Sodium: 139 mmol/L (ref 134–144)
Total Protein: 7.3 g/dL (ref 6.0–8.5)
eGFR: 84 mL/min/{1.73_m2} (ref >59)

## 2022-10-04 LAB — CBC WITH DIFFERENTIAL/PLATELET
Basophils Absolute: 0 10*3/uL (ref 0.0–0.2)
Basos: 1 %
EOS (ABSOLUTE): 0.1 10*3/uL (ref 0.0–0.4)
Eos: 2 %
Hematocrit: 38.9 % (ref 34.0–46.6)
Hemoglobin: 13.3 g/dL (ref 11.1–15.9)
Immature Grans (Abs): 0 10*3/uL (ref 0.0–0.1)
Immature Granulocytes: 0 %
Lymphocytes Absolute: 1.3 10*3/uL (ref 0.7–3.1)
Lymphs: 32 %
MCH: 31 pg (ref 26.6–33.0)
MCHC: 34.2 g/dL (ref 31.5–35.7)
MCV: 91 fL (ref 79–97)
Monocytes Absolute: 0.4 10*3/uL (ref 0.1–0.9)
Monocytes: 10 %
Neutrophils Absolute: 2.3 10*3/uL (ref 1.4–7.0)
Neutrophils: 55 %
Platelets: 274 10*3/uL (ref 150–450)
RBC: 4.29 x10E6/uL (ref 3.77–5.28)
RDW: 12 % (ref 11.7–15.4)
WBC: 4.1 10*3/uL (ref 3.4–10.8)

## 2022-10-04 LAB — LIPID PANEL
Chol/HDL Ratio: 2 ratio (ref 0.0–4.4)
Cholesterol, Total: 281 mg/dL — ABNORMAL HIGH (ref 100–199)
HDL: 139 mg/dL (ref >39)
LDL Chol Calc (NIH): 132 mg/dL — ABNORMAL HIGH (ref 0–99)
Triglycerides: 64 mg/dL (ref 0–149)
VLDL Cholesterol Cal: 10 mg/dL (ref 5–40)

## 2022-10-04 LAB — VITAMIN D 25 HYDROXY (VIT D DEFICIENCY, FRACTURES): Vit D, 25-Hydroxy: 82.2 ng/mL (ref 30.0–100.0)

## 2022-10-04 LAB — HEMOGLOBIN A1C
Est. average glucose Bld gHb Est-mCnc: 111 mg/dL
Hgb A1c MFr Bld: 5.5 % (ref 4.8–5.6)

## 2022-10-04 NOTE — Progress Notes (Signed)
Cholesterol has increased in total as well as bad/LDL cholesterol I recommend diet low in saturated fat and regular exercise - 30 min at least 5 times per week. Could start stain to assist in ASCVD risk reduction.  Chemistry shows elevation in liver enzymes. If not improved with diet/exercise. Recommend referral to specialist to discuss fibroscan to r/o cirrhosis.  Vit D much improved; could scale back on supplementation if desired.  A1c remains stable.  Cell count and thyroid both normal

## 2022-10-08 ENCOUNTER — Telehealth: Payer: Self-pay

## 2022-10-08 NOTE — Telephone Encounter (Signed)
Pt called regarding recent lab results. Pt is concerned about her liver enzymes being elevated. Pt doubled her Fe supplement in the past few months, and wonders if this is the reason her Liver enzymes are elevated.   PT reports exercising and drinking a half gallon on water daily.  Pt would like a call back regarding this question. She would also like to know what her next steps should be.

## 2022-10-10 NOTE — Telephone Encounter (Signed)
Spoke with patient, she said she will work on diet and limiting NSAID use and do repeat labs.

## 2022-11-29 ENCOUNTER — Ambulatory Visit
Admission: RE | Admit: 2022-11-29 | Discharge: 2022-11-29 | Disposition: A | Discharge status: Home / Self Care | Payer: 59 | Source: Ambulatory Visit | Attending: Family Medicine | Admitting: Family Medicine

## 2022-11-29 DIAGNOSIS — Z1231 Encounter for screening mammogram for malignant neoplasm of breast: Secondary | ICD-10-CM | POA: Diagnosis present

## 2022-12-03 NOTE — Progress Notes (Signed)
[place]  Normal mammogram; repeat in 1 year.  Please let us know if you have any questions.  Thank you,  Daliana Leverett, FNP 

## 2023-01-24 ENCOUNTER — Encounter: Payer: Self-pay | Admitting: Family Medicine

## 2023-01-24 ENCOUNTER — Ambulatory Visit: Payer: Self-pay

## 2023-01-24 ENCOUNTER — Ambulatory Visit (INDEPENDENT_AMBULATORY_CARE_PROVIDER_SITE_OTHER): Payer: 59 | Admitting: Family Medicine

## 2023-01-24 VITALS — BP 126/86 | HR 72 | Temp 97.8°F | Wt 149.7 lb

## 2023-01-24 DIAGNOSIS — G5761 Lesion of plantar nerve, right lower limb: Secondary | ICD-10-CM | POA: Diagnosis not present

## 2023-01-24 DIAGNOSIS — G5760 Lesion of plantar nerve, unspecified lower limb: Secondary | ICD-10-CM | POA: Diagnosis not present

## 2023-01-24 DIAGNOSIS — I998 Other disorder of circulatory system: Secondary | ICD-10-CM | POA: Diagnosis not present

## 2023-01-24 NOTE — Assessment & Plan Note (Signed)
Atypical presentation; R third toe with numbness/tingling near nail bed which has recently expanded to ball of foot Referral to podiatry for further assistance Referral to vascular given c/f vascular insufficiency

## 2023-01-24 NOTE — Telephone Encounter (Signed)
Chief Complaint: numbness to feet/toes Symptoms: none Frequency: since 07/2022 Pertinent Negatives: Patient denies other symptoms, no pain Disposition: []$ ED /[]$ Urgent Care (no appt availability in office) / [x]$ Appointment(In office/virtual)/ []$  [place] [facility]/ []$ Home Care/ []$ Refused Recommended Disposition /[]$ [place] [facility]/ []$  Follow-up with PCP Additional Notes: Advised ED worsening symptoms.    Reason for Disposition  [1] Numbness or tingling in one or both feet AND [2] is a chronic symptom (recurrent or ongoing AND present > 4 weeks)  Answer Assessment - Initial Assessment Questions 1. SYMPTOM: "What is the main symptom you are concerned about?" (e.g., weakness, numbness)     Numbness in feet 2. ONSET: "When did this start?" (minutes, hours, days; while sleeping)     August 2023 3. LAST NORMAL: "When was the last time you (the patient) were normal (no symptoms)?"     N/A 4. PATTERN "Does this come and go, or has it been constant since it started?"  "Is it present now?"     Comes and goes 5. NEUROLOGIC SYMPTOMS: "Have you had any of the following symptoms: headache, dizziness, vision loss, double vision, changes in speech, unsteady on your feet?"     Tingling to the ball of feet 6. OTHER SYMPTOMS: "Do you have any other symptoms?"     No  Protocols used: Neurologic Deficit-A-AH

## 2023-01-24 NOTE — Assessment & Plan Note (Signed)
BLE cool; pt denies concern with temperature "they feel like they always feel" however, R third toe with numbness/tingling near nail bed which has recently expanded to ball of foot Referral to vascular for duplex/PVL studies

## 2023-01-24 NOTE — Progress Notes (Signed)
I,Connie R Striblin,acting as a Education administrator for Gwyneth Sprout, FNP.,have documented all relevant documentation on the behalf of Gwyneth Sprout, FNP,as directed by  Gwyneth Sprout, FNP while in the presence of Gwyneth Sprout, FNP.  Established patient visit  Patient: Karen Randolph   DOB: [birth year]   57 y.o. Female  MRN: [id number] Visit Date: 01/24/2023  Today's healthcare provider: Gwyneth Sprout, FNP  Introduced to nurse practitioner role and practice setting.  All questions answered.  Discussed provider/patient relationship and expectations.  Chief Complaint  Patient presents with   Numbness        Subjective    HPI HPI     Numbness    Additional comments:        Last edited by Araceli Bouche, CMA on 01/24/2023  3:01 PM.      Numbness: Patient presents today with numbness in her lower extremities ( R foot 3rd toe) Symptoms started August 2023 and worsening over time. There was no injuries Patient denies any other symptoms at this time.  Medications: Outpatient Medications Prior to Visit  Medication Sig   ferrous sulfate 325 (65 FE) MG tablet Take 1 tablet (325 mg total) by mouth daily.   levothyroxine (EUTHYROX) 75 MCG tablet Take 1 tablet (75 mcg total) by mouth daily before breakfast.   Multiple Vitamin (MULTIVITAMIN) tablet Take 1 tablet by mouth daily.   OMEGA-3 FATTY ACIDS PO 1 tablet daily.   Vitamin D, Cholecalciferol, 400 UNITS TABS Take 1 tablet by mouth daily.   No facility-administered medications prior to visit.    Review of Systems     Objective    BP 126/86 (BP Location: Right Arm, Patient Position: Sitting, Cuff Size: Normal)   Pulse 72   Temp 97.8 F (36.6 C) (Oral)   Wt 149 lb 11.2 oz (67.9 kg)   LMP 06/08/2020   SpO2 98%   BMI 26.52 kg/m    Physical Exam Vitals and nursing note reviewed.  Constitutional:      General: She is not in acute distress.    Appearance: Normal appearance. She is overweight. She is not ill-appearing,  toxic-appearing or diaphoretic.  HENT:     Head: Normocephalic and atraumatic.  Cardiovascular:     Rate and Rhythm: Normal rate and regular rhythm.     Pulses:          Dorsalis pedis pulses are 1+ on the right side and 1+ on the left side.       Posterior tibial pulses are 1+ on the right side and 1+ on the left side.     Heart sounds: Normal heart sounds. No murmur heard.    No friction rub. No gallop.  Pulmonary:     Effort: Pulmonary effort is normal. No respiratory distress.     Breath sounds: Normal breath sounds. No stridor. No wheezing, rhonchi or rales.  Chest:     Chest wall: No tenderness.  Abdominal:     General: Bowel sounds are normal.     Palpations: Abdomen is soft.  Musculoskeletal:        General: No swelling, tenderness, deformity or signs of injury. Normal range of motion.     Right lower leg: No edema.     Left lower leg: No edema.       Feet:  Feet:     Right foot:     Toenail Condition: Right toenails are normal.     Left foot:  Toenail Condition: Left toenails are normal.     Comments: Cool feet and BLE- referral to vascular  Skin:    General: Skin is warm and dry.     Capillary Refill: Capillary refill takes less than 2 seconds.     Coloration: Skin is not jaundiced or pale.     Findings: No bruising, erythema, lesion or rash.  Neurological:     General: No focal deficit present.     Mental Status: She is alert and oriented to person, place, and time. Mental status is at baseline.     Cranial Nerves: No cranial nerve deficit.     Sensory: No sensory deficit.     Motor: No weakness.     Coordination: Coordination normal.  Psychiatric:        Mood and Affect: Mood normal.        Behavior: Behavior normal.        Thought Content: Thought content normal.        Judgment: Judgment normal.     No results found for any visits on 01/24/23.  Assessment & Plan     Problem List Items Addressed This Visit       Cardiovascular and Mediastinum    Vascular insufficiency    BLE cool; pt denies concern with temperature "they feel like they always feel" however, R third toe with numbness/tingling near nail bed which has recently expanded to ball of foot Referral to vascular for duplex/PVL studies       Relevant Orders   Ambulatory referral to Vascular Surgery     Nervous and Auditory   Morton's neuralgia, right    Atypical presentation; R third toe with numbness/tingling near nail bed which has recently expanded to ball of foot Referral to podiatry for further assistance Referral to vascular given c/f vascular insufficiency       Relevant Orders   Ambulatory referral to Podiatry   Ambulatory referral to Vascular Surgery   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     Vonna Kotyk, FNP, have reviewed all documentation for this visit. The documentation on 01/24/23 for the exam, diagnosis, procedures, and orders are all accurate and complete.  Gwyneth Sprout, [place] [phone] (phone) [phone] (fax)  [place]

## 2023-02-03 ENCOUNTER — Encounter: Payer: Self-pay | Admitting: Family Medicine

## 2023-02-03 ENCOUNTER — Other Ambulatory Visit: Payer: Self-pay | Admitting: Family Medicine

## 2023-02-03 DIAGNOSIS — E039 Hypothyroidism, unspecified: Secondary | ICD-10-CM

## 2023-02-03 MED ORDER — LEVOTHYROXINE SODIUM 75 MCG PO TABS: 75.0000 ug | ORAL_TABLET | Freq: Every day | ORAL | 0 refills | Status: DC

## 2023-02-03 MED ORDER — LEVOTHYROXINE SODIUM 75 MCG PO TABS: 75.0000 ug | ORAL_TABLET | Freq: Every day | ORAL | 3 refills | Status: DC

## 2023-02-12 ENCOUNTER — Encounter: Payer: Self-pay | Admitting: Podiatry

## 2023-02-12 ENCOUNTER — Ambulatory Visit (INDEPENDENT_AMBULATORY_CARE_PROVIDER_SITE_OTHER): Payer: 59 | Admitting: Podiatry

## 2023-02-12 ENCOUNTER — Ambulatory Visit: Payer: 59

## 2023-02-12 DIAGNOSIS — G5761 Lesion of plantar nerve, right lower limb: Secondary | ICD-10-CM | POA: Diagnosis not present

## 2023-02-13 NOTE — Progress Notes (Signed)
  Subjective:  Patient ID: Karen Randolph, female    DOB: 03/06/1966,  MRN: [id number] HPI Chief Complaint  Patient presents with   Toe Pain    3rd toe right - medial border at nail- numbness only since August 2023, no injury or pain, can usually feel it when she walks a lot   New Patient (Initial Visit)    57 y.o. female presents with the above complaint.   ROS: Denies fever chills nausea vomit muscle aches pains calf pain back pain chest pain shortness of breath.  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   Thyroid disease    Past Surgical History:  Procedure Laterality Date   BRAIN TUMOR EXCISION  2007,2010   COLONOSCOPY WITH PROPOFOL N/A 10/12/2018   Procedure: COLONOSCOPY WITH PROPOFOL;  Surgeon: Jonathon Bellows, MD;  Location: [facility] ENDOSCOPY;  Service: Gastroenterology;  Laterality: N/A;    Current Outpatient Medications:    ferrous sulfate 325 (65 FE) MG tablet, Take 1 tablet (325 mg total) by mouth daily., Disp: 30 tablet, Rfl: 0   levothyroxine (EUTHYROX) 75 MCG tablet, Take 1 tablet (75 mcg total) by mouth daily before breakfast., Disp: 90 tablet, Rfl: 3   Multiple Vitamin (MULTIVITAMIN) tablet, Take 1 tablet by mouth daily., Disp: , Rfl:    OMEGA-3 FATTY ACIDS PO, 1 tablet daily., Disp: , Rfl:    Vitamin D, Cholecalciferol, 400 UNITS TABS, Take 1 tablet by mouth daily., Disp: , Rfl:   Allergies  Allergen Reactions   Shellfish Allergy    Review of Systems Objective:  There were no vitals filed for this visit.  General: Well developed, nourished, in no acute distress, alert and oriented x3   Dermatological: Skin is warm, dry and supple bilateral. Nails x 10 are well maintained; remaining integument appears unremarkable at this time. There are no open sores, no preulcerative lesions, no rash or signs of infection present.  Vascular: Dorsalis Pedis artery and Posterior Tibial artery pedal pulses are 2/4 bilateral with  immedate capillary fill time. Pedal hair growth present. No varicosities and no lower extremity edema present bilateral.   Neruologic: Grossly intact via light touch bilateral. Vibratory intact via tuning fork bilateral. Protective threshold with Semmes Wienstein monofilament intact to all pedal sites bilateral. Patellar and Achilles deep tendon reflexes 2+ bilateral. No Babinski or clonus noted bilateral.  Palpable Mulder's click second and third interdigital space right foot.  Musculoskeletal: No gross boney pedal deformities bilateral. No pain, crepitus, or limitation noted with foot and ankle range of motion bilateral. Muscular strength 5/5 in all groups tested bilateral.  Gait: Unassisted, Nonantalgic.    Radiographs:  None taken  Assessment & Plan:   Assessment: Neuroma second third interspace right  Plan: Discussed etiology pathology and surgical therapies offered injection she declined she would like to try a topical anti-inflammatory such as Voltaren or diclofenac I wrote these down for her and she will follow-up with me as needed we did discuss appropriate shoe gear.     Braydyn Schultes T. [place], [place]

## 2023-04-17 ENCOUNTER — Encounter (INDEPENDENT_AMBULATORY_CARE_PROVIDER_SITE_OTHER): Payer: 59

## 2023-04-17 ENCOUNTER — Encounter (INDEPENDENT_AMBULATORY_CARE_PROVIDER_SITE_OTHER): Payer: 59 | Admitting: Vascular Surgery

## 2023-05-01 ENCOUNTER — Other Ambulatory Visit: Payer: Self-pay | Admitting: Family Medicine

## 2023-05-01 MED ORDER — SCOPOLAMINE 1 MG/3DAYS TD PT72: 1.0000 | MEDICATED_PATCH | TRANSDERMAL | 12 refills | Status: DC

## 2023-07-28 ENCOUNTER — Ambulatory Visit (INDEPENDENT_AMBULATORY_CARE_PROVIDER_SITE_OTHER): Payer: 59 | Admitting: Family Medicine

## 2023-07-28 ENCOUNTER — Encounter: Payer: Self-pay | Admitting: Family Medicine

## 2023-07-28 VITALS — BP 126/77 | HR 71 | Ht 63.5 in | Wt 136.6 lb

## 2023-07-28 DIAGNOSIS — D619 Aplastic anemia, unspecified: Secondary | ICD-10-CM

## 2023-07-28 DIAGNOSIS — E039 Hypothyroidism, unspecified: Secondary | ICD-10-CM

## 2023-07-28 DIAGNOSIS — E7800 Pure hypercholesterolemia, unspecified: Secondary | ICD-10-CM | POA: Diagnosis not present

## 2023-07-28 DIAGNOSIS — Z2989 Encounter for other specified prophylactic measures: Secondary | ICD-10-CM

## 2023-07-28 DIAGNOSIS — E663 Overweight: Secondary | ICD-10-CM | POA: Diagnosis not present

## 2023-07-28 DIAGNOSIS — R7989 Other specified abnormal findings of blood chemistry: Secondary | ICD-10-CM

## 2023-07-28 DIAGNOSIS — D509 Iron deficiency anemia, unspecified: Secondary | ICD-10-CM

## 2023-07-28 MED ORDER — ACETAZOLAMIDE 125 MG PO TABS: 125.0000 mg | ORAL_TABLET | Freq: Three times a day (TID) | ORAL | 0 refills | Status: DC

## 2023-07-28 NOTE — Assessment & Plan Note (Signed)
Request for diomox; upcoming hiking trip to [place] in 3 weeks

## 2023-07-28 NOTE — Assessment & Plan Note (Signed)
Chronic, stable Remains on low dose iron daily

## 2023-07-28 NOTE — Assessment & Plan Note (Signed)
Repeat CMP; no focal complaints Diet is improved as is exercise

## 2023-07-28 NOTE — Progress Notes (Signed)
Established patient visit   Patient: Karen Randolph   DOB: 28-Mar-1966   57 y.o. Female  MRN: [id number] Visit Date: 07/28/2023  Today's healthcare provider: Jacky Kindle, FNP  Introduced to nurse practitioner role and practice setting.  All questions answered.  Discussed provider/patient relationship and expectations.  Subjective    HPI HPI     Medical Management of Chronic Issues    Additional comments: Would like a Diomox before hiking trip, would like blood panel due to having high numbers on kidney or liver test       Last edited by Rolly Salter, CMA on 07/28/2023  1:28 PM.      Medications: Outpatient Medications Prior to Visit  Medication Sig   ferrous sulfate 325 (65 FE) MG tablet Take 1 tablet (325 mg total) by mouth daily.   levothyroxine (EUTHYROX) 75 MCG tablet Take 1 tablet (75 mcg total) by mouth daily before breakfast.   Multiple Vitamin (MULTIVITAMIN) tablet Take 1 tablet by mouth daily.   OMEGA-3 FATTY ACIDS PO 1 tablet daily.   Vitamin D, Cholecalciferol, 400 UNITS TABS Take 1 tablet by mouth daily.   scopolamine (TRANSDERM-SCOP) 1 MG/3DAYS Place 1 patch (1.5 mg total) onto the skin every 3 (three) days. (Patient not taking: Reported on 07/28/2023)   No facility-administered medications prior to visit.      Objective    BP 126/77 (BP Location: Right Arm, Patient Position: Sitting, Cuff Size: Large)   Pulse 71   Ht 5' 3.5" (1.613 m)   Wt 136 lb 9.6 oz (62 kg)   LMP 06/08/2020   SpO2 100%   BMI 23.82 kg/m   Physical Exam Vitals and nursing note reviewed.  Constitutional:      General: She is not in acute distress.    Appearance: Normal appearance. She is normal weight. She is not ill-appearing, toxic-appearing or diaphoretic.  HENT:     Head: Normocephalic and atraumatic.  Cardiovascular:     Rate and Rhythm: Normal rate and regular rhythm.     Pulses: Normal pulses.     Heart sounds: Normal heart sounds. No murmur heard.    No  friction rub. No gallop.  Pulmonary:     Effort: Pulmonary effort is normal. No respiratory distress.     Breath sounds: Normal breath sounds. No stridor. No wheezing, rhonchi or rales.  Chest:     Chest wall: No tenderness.  Musculoskeletal:        General: No swelling, tenderness, deformity or signs of injury. Normal range of motion.     Right lower leg: No edema.     Left lower leg: No edema.  Skin:    General: Skin is warm and dry.     Capillary Refill: Capillary refill takes less than 2 seconds.     Coloration: Skin is not jaundiced or pale.     Findings: No bruising, erythema, lesion or rash.  Neurological:     General: No focal deficit present.     Mental Status: She is alert and oriented to person, place, and time. Mental status is at baseline.     Cranial Nerves: No cranial nerve deficit.     Sensory: No sensory deficit.     Motor: No weakness.     Coordination: Coordination normal.  Psychiatric:        Mood and Affect: Mood normal.        Behavior: Behavior normal.        Thought Content:  Thought content normal.        Judgment: Judgment normal.     No results found for any visits on 07/28/23.  Assessment & Plan     Problem List Items Addressed This Visit       Endocrine   Hypothyroidism    Chronic, stable Remains on 75 mcg euthyrox Repeat labs iso weight loss      Relevant Orders   TSH     Hematopoietic and Hemostatic   Aplastic anemia (HCC)    Chronic, stable Repeat CBC; remains on iron 65 FE every day         Other   Altitude sickness preventative measures - Primary    Request for diomox; upcoming hiking trip to [place] in 3 weeks       Elevated LDL cholesterol level    Repeat LP iso weight loss recommend diet low in saturated fat and regular exercise - 30 min at least 5 times per week Controlled with diet/exercise      Relevant Orders   Lipid panel   Elevated liver function tests    Repeat CMP; no focal complaints Diet is improved as  is exercise      Relevant Orders   Comprehensive Metabolic Panel (CMET)   Iron deficiency anemia    Chronic, stable Remains on low dose iron daily      Relevant Orders   CBC   RESOLVED: Overweight (BMI 25.0-29.9)    Return if symptoms worsen or fail to improve, for annual examination.      Leilani Merl, FNP, have reviewed all documentation for this visit. The documentation on 07/28/23 for the exam, diagnosis, procedures, and orders are all accurate and complete.  Jacky Kindle, FNP  [facility] [phone] (phone) [phone] (fax)  [facility]

## 2023-07-28 NOTE — Assessment & Plan Note (Signed)
Chronic, stable Repeat CBC; remains on iron 65 FE every day

## 2023-07-28 NOTE — Assessment & Plan Note (Signed)
Chronic, stable Remains on 75 mcg euthyrox Repeat labs iso weight loss

## 2023-07-28 NOTE — Assessment & Plan Note (Signed)
Repeat LP iso weight loss recommend diet low in saturated fat and regular exercise - 30 min at least 5 times per week Controlled with diet/exercise

## 2023-07-29 LAB — COMPREHENSIVE METABOLIC PANEL
ALT: 20 IU/L (ref 0–32)
AST: 18 IU/L (ref 0–40)
Albumin: 4.4 g/dL (ref 3.8–4.9)
Alkaline Phosphatase: 78 IU/L (ref 44–121)
BUN/Creatinine Ratio: 25 — ABNORMAL HIGH (ref 9–23)
BUN: 22 mg/dL (ref 6–24)
Bilirubin Total: 0.2 mg/dL (ref 0.0–1.2)
CO2: 24 mmol/L (ref 20–29)
Calcium: 10 mg/dL (ref 8.7–10.2)
Chloride: 103 mmol/L (ref 96–106)
Creatinine, Ser: 0.87 mg/dL (ref 0.57–1.00)
Globulin, Total: 2.7 g/dL (ref 1.5–4.5)
Glucose: 87 mg/dL (ref 70–99)
Potassium: 4.4 mmol/L (ref 3.5–5.2)
Sodium: 141 mmol/L (ref 134–144)
Total Protein: 7.1 g/dL (ref 6.0–8.5)
eGFR: 79 mL/min/{1.73_m2} (ref >59)

## 2023-07-29 LAB — LIPID PANEL
Chol/HDL Ratio: 2.2 ratio (ref 0.0–4.4)
Cholesterol, Total: 233 mg/dL — ABNORMAL HIGH (ref 100–199)
HDL: 106 mg/dL (ref >39)
LDL Chol Calc (NIH): 116 mg/dL — ABNORMAL HIGH (ref 0–99)
Triglycerides: 67 mg/dL (ref 0–149)
VLDL Cholesterol Cal: 11 mg/dL (ref 5–40)

## 2023-07-29 LAB — CBC
Hematocrit: 41.6 % (ref 34.0–46.6)
Hemoglobin: 14.3 g/dL (ref 11.1–15.9)
MCH: 32.4 pg (ref 26.6–33.0)
MCHC: 34.4 g/dL (ref 31.5–35.7)
MCV: 94 fL (ref 79–97)
Platelets: 267 10*3/uL (ref 150–450)
RBC: 4.41 x10E6/uL (ref 3.77–5.28)
RDW: 11.8 % (ref 11.7–15.4)
WBC: 4.2 10*3/uL (ref 3.4–10.8)

## 2023-07-29 LAB — TSH: TSH: 0.916 u[IU]/mL (ref 0.450–4.500)

## 2023-08-05 ENCOUNTER — Telehealth: Payer: Self-pay | Admitting: Family Medicine

## 2023-08-05 NOTE — Telephone Encounter (Signed)
Pt is calling in because she has questions about her labs that she would like to discuss with Robynn Pane. Please follow up with pt.

## 2023-08-06 ENCOUNTER — Telehealth: Payer: Self-pay | Admitting: *Deleted

## 2023-08-06 NOTE — Telephone Encounter (Signed)
  Chief Complaint: results Symptoms: NA Frequency: NA Pertinent Negatives: Patient denies NA Disposition: [] ED /[] Urgent Care (no appt availability in office) / [] Appointment(In office/virtual)/ []  [place] [facility]/ [] Home Care/ [] Refused Recommended Disposition /[] [place] [facility]/ [x]  Follow-up with PCP Additional Notes:  Pt reviewed lab results on MyChart. States she is concerned as she is to start Diamox and noted BUN/CR ratio elevated at 25. Concern regarding taking the medication.   Please advise.

## 2023-08-06 NOTE — Telephone Encounter (Signed)
Pt given lab results per notes of E. Suzie Portela, FNP on 08/06/23. Pt verbalized understanding and has already reviewed results but has questions. Patient concerned regarding BUN /creatinine ratio 25 which is elevated. Patient is going on a hiking trip Friday at [place] alt[place] and was to take diamox. Is taking diamox going to be an issue with the recent results of BUN/creatine ratio? Also should patient decrease the amount of protein , she has been taking atleast 100 g / day in the foods she eats? Please advise before Friday afternoon. She will be going on her trip Friday. Requesting call back.

## 2023-08-06 NOTE — Telephone Encounter (Signed)
LVMTCB. CRM created. OK for PEC to verify what questions patient had.

## 2023-08-06 NOTE — Telephone Encounter (Signed)
08/06/23 12:45 PM No concerns; continue to ensure hydration during trip/hiking.  Patient advised.

## 2023-08-07 NOTE — Telephone Encounter (Signed)
08/06/23 12:45 PM No concerns; continue to ensure hydration during trip/hiking.  Patient advised.

## 2023-10-10 ENCOUNTER — Other Ambulatory Visit (HOSPITAL_COMMUNITY)
Admission: RE | Admit: 2023-10-10 | Discharge: 2023-10-10 | Disposition: A | Discharge status: Home / Self Care | Payer: 59 | Source: Ambulatory Visit | Attending: Family Medicine | Admitting: Family Medicine

## 2023-10-10 ENCOUNTER — Ambulatory Visit (INDEPENDENT_AMBULATORY_CARE_PROVIDER_SITE_OTHER): Payer: 59 | Admitting: Family Medicine

## 2023-10-10 ENCOUNTER — Encounter: Payer: Self-pay | Admitting: Family Medicine

## 2023-10-10 VITALS — BP 115/74 | HR 65 | Temp 98.1°F | Resp 16 | Ht 63.0 in | Wt 138.5 lb

## 2023-10-10 DIAGNOSIS — Z01411 Encounter for gynecological examination (general) (routine) with abnormal findings: Secondary | ICD-10-CM

## 2023-10-10 DIAGNOSIS — D619 Aplastic anemia, unspecified: Secondary | ICD-10-CM | POA: Diagnosis not present

## 2023-10-10 DIAGNOSIS — Z124 Encounter for screening for malignant neoplasm of cervix: Secondary | ICD-10-CM | POA: Insufficient documentation

## 2023-10-10 DIAGNOSIS — Z23 Encounter for immunization: Secondary | ICD-10-CM

## 2023-10-10 DIAGNOSIS — Z0000 Encounter for general adult medical examination without abnormal findings: Secondary | ICD-10-CM

## 2023-10-10 DIAGNOSIS — Z1231 Encounter for screening mammogram for malignant neoplasm of breast: Secondary | ICD-10-CM | POA: Diagnosis not present

## 2023-10-10 NOTE — Progress Notes (Unsigned)
Complete physical exam   Patient: Karen Randolph   DOB: 18-Oct-1966   57 y.o. Female  MRN: [id number] Visit Date: 10/10/2023  Today's healthcare provider: Jacky Kindle, FNP  Re Introduced to nurse practitioner role and practice setting.  All questions answered.  Discussed provider/patient relationship and expectations.  Chief Complaint  Patient presents with   Annual Exam   Subjective    Karen Randolph is a 57 y.o. female who presents today for a complete physical exam.   The patient, with a history of thyroid issues, presents for a routine check-up. She recently returned from a trip and is planning another one. She reports feeling good overall. The patient mentions having fluid in her ears, but does not report any discomfort or hearing issues. She is currently on thyroid medication and is taking lotus iron, multivitamin, fish oils, and over-the-counter vitamin D.  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   Thyroid disease    Past Surgical History:  Procedure Laterality Date   BRAIN TUMOR EXCISION  2007,2010   COLONOSCOPY WITH PROPOFOL N/A 10/12/2018   Procedure: COLONOSCOPY WITH PROPOFOL;  Surgeon: Wyline Mood, MD;  Location: [facility] ENDOSCOPY;  Service: Gastroenterology;  Laterality: N/A;   Social History   Socioeconomic History   Marital status: Single    Spouse name: Not on file   Number of children: Not on file   Years of education: Not on file   Highest education level: Not on file  Occupational History   Not on file  Tobacco Use   Smoking status: Never   Smokeless tobacco: Never  Vaping Use   Vaping status: Never Used  Substance and Sexual Activity   Alcohol use: No    Alcohol/week: 0.0 standard drinks of alcohol   Drug use: No   Sexual activity: Never  Other Topics Concern   Not on file  Social History Narrative   Not on file   Social Determinants of Health   Financial Resource Strain: Patient  Declined (10/09/2023)   Overall Financial Resource Strain (CARDIA)    Difficulty of Paying Living Expenses: Patient declined  Food Insecurity: Patient Declined (10/09/2023)   Hunger Vital Sign    Worried About Running Out of Food in the Last Year: Patient declined    Ran Out of Food in the Last Year: Patient declined  Transportation Needs: Patient Declined (10/09/2023)   PRAPARE - Administrator, Civil Service (Medical): Patient declined    Lack of Transportation (Non-Medical): Patient declined  Physical Activity: Sufficiently Active (10/09/2023)   Exercise Vital Sign    Days of Exercise per Week: 4 days    Minutes of Exercise per Session: 50 min  Stress: No Stress Concern Present (10/09/2023)   [employer] of Occupational Health - Occupational Stress Questionnaire    Feeling of Stress : Not at all  Social Connections: Unknown (10/09/2023)   Social Connection and Isolation Panel [NHANES]    Frequency of Communication with Friends and Family: Patient declined    Frequency of Social Gatherings with Friends and Family: Patient declined    Attends Religious Services: Patient declined    Database administrator or Organizations: Patient declined    Attends Banker Meetings: Not on file    Marital Status: Patient declined  Intimate Partner Violence: Not on file   Family Status  Relation Name Status   Mother  Deceased at age 69  Father  Deceased at age 12   MGM  Deceased   MGF  Deceased   PGM  Deceased   PGF  Deceased   Brother  (Not Specified)  No partnership data on file   Family History  Problem Relation Age of Onset   Alcohol abuse Mother    CVA Father    CAD Father    Hypertension Father    Heart attack Father    Healthy Brother    Allergies  Allergen Reactions   Shellfish Allergy     Patient Care Team: Jacky Kindle, FNP as PCP - General (Family Medicine) Creig Hines, MD as Consulting Physician (Hematology and Oncology)    Medications: Outpatient Medications Prior to Visit  Medication Sig   ferrous sulfate 325 (65 FE) MG tablet Take 1 tablet (325 mg total) by mouth daily.   levothyroxine (EUTHYROX) 75 MCG tablet Take 1 tablet (75 mcg total) by mouth daily before breakfast.   Multiple Vitamin (MULTIVITAMIN) tablet Take 1 tablet by mouth daily.   OMEGA-3 FATTY ACIDS PO 1 tablet daily.   Vitamin D, Cholecalciferol, 400 UNITS TABS Take 1 tablet by mouth daily.   [DISCONTINUED] acetaZOLAMIDE (DIAMOX) 125 MG tablet Take 1 tablet (125 mg total) by mouth 3 (three) times daily.   [DISCONTINUED] scopolamine (TRANSDERM-SCOP) 1 MG/3DAYS Place 1 patch (1.5 mg total) onto the skin every 3 (three) days. (Patient not taking: Reported on 07/28/2023)   No facility-administered medications prior to visit.    Objective    BP 115/74 (BP Location: Left Arm, Patient Position: Sitting, Cuff Size: Normal)   Pulse 65   Temp 98.1 F (36.7 C) (Oral)   Resp 16   Ht 5\' 3"  (1.6 m)   Wt 138 lb 8 oz (62.8 kg)   LMP 06/08/2020   BMI 24.53 kg/m   Physical Exam Vitals and nursing note reviewed. Exam conducted with a chaperone present.  Constitutional:      General: She is awake. She is not in acute distress.    Appearance: Normal appearance. She is well-developed, well-groomed and normal weight. She is not ill-appearing, toxic-appearing or diaphoretic.  HENT:     Head: Normocephalic and atraumatic.     Jaw: There is normal jaw occlusion. No trismus, tenderness, swelling or pain on movement.     Right Ear: Hearing, tympanic membrane, ear canal and external ear normal. There is no impacted cerumen.     Left Ear: Hearing, tympanic membrane, ear canal and external ear normal. There is no impacted cerumen.     Nose: Nose normal. No congestion or rhinorrhea.     Right Turbinates: Not enlarged, swollen or pale.     Left Turbinates: Not enlarged, swollen or pale.     Right Sinus: No maxillary sinus tenderness or frontal sinus  tenderness.     Left Sinus: No maxillary sinus tenderness or frontal sinus tenderness.     Mouth/Throat:     Lips: Pink.     Mouth: Mucous membranes are moist. No injury.     Tongue: No lesions.     Pharynx: Oropharynx is clear. Uvula midline. No pharyngeal swelling, oropharyngeal exudate, posterior oropharyngeal erythema or uvula swelling.     Tonsils: No tonsillar exudate or tonsillar abscesses.  Eyes:     General: Lids are normal. Lids are everted, no foreign bodies appreciated. Vision grossly intact. Gaze aligned appropriately. No allergic shiner or visual field deficit.       Right eye: No discharge.  Left eye: No discharge.     Extraocular Movements: Extraocular movements intact.     Conjunctiva/sclera: Conjunctivae normal.     Right eye: Right conjunctiva is not injected. No exudate.    Left eye: Left conjunctiva is not injected. No exudate.    Pupils: Pupils are equal, round, and reactive to light.  Neck:     Thyroid: No thyroid mass, thyromegaly or thyroid tenderness.     Vascular: No carotid bruit.     Trachea: Trachea normal.  Cardiovascular:     Rate and Rhythm: Normal rate and regular rhythm.     Pulses: Normal pulses.          Carotid pulses are 2+ on the right side and 2+ on the left side.      Radial pulses are 2+ on the right side and 2+ on the left side.       Dorsalis pedis pulses are 2+ on the right side and 2+ on the left side.       Posterior tibial pulses are 2+ on the right side and 2+ on the left side.     Heart sounds: Normal heart sounds, S1 normal and S2 normal. No murmur heard.    No friction rub. No gallop.  Pulmonary:     Effort: Pulmonary effort is normal. No respiratory distress.     Breath sounds: Normal breath sounds and air entry. No stridor. No wheezing, rhonchi or rales.  Chest:     Chest wall: No tenderness.  Abdominal:     General: Abdomen is flat. Bowel sounds are normal. There is no distension.     Palpations: Abdomen is soft. There  is no mass.     Tenderness: There is no abdominal tenderness. There is no right CVA tenderness, left CVA tenderness, guarding or rebound.     Hernia: No hernia is present.  Genitourinary:    General: Normal vulva.     Exam position: Lithotomy position.     Tanner stage (genital): 5.     Vagina: Normal.     Cervix: Normal.     Uterus: Normal.      Adnexa: Right adnexa normal and left adnexa normal.     Comments: PAP completed; rectal exam deferred Musculoskeletal:        General: No swelling, tenderness, deformity or signs of injury. Normal range of motion.     Cervical back: Full passive range of motion without pain, normal range of motion and neck supple. No edema, rigidity or tenderness. No muscular tenderness.     Right lower leg: No edema.     Left lower leg: No edema.  Lymphadenopathy:     Cervical: No cervical adenopathy.     Right cervical: No superficial, deep or posterior cervical adenopathy.    Left cervical: No superficial, deep or posterior cervical adenopathy.  Skin:    General: Skin is warm and dry.     Capillary Refill: Capillary refill takes less than 2 seconds.     Coloration: Skin is not jaundiced or pale.     Findings: No bruising, erythema, lesion or rash.  Neurological:     General: No focal deficit present.     Mental Status: She is alert and oriented to person, place, and time. Mental status is at baseline.     GCS: GCS eye subscore is 4. GCS verbal subscore is 5. GCS motor subscore is 6.     Sensory: Sensation is intact. No sensory deficit.  Motor: Motor function is intact. No weakness.     Coordination: Coordination is intact. Coordination normal.     Gait: Gait is intact. Gait normal.  Psychiatric:        Attention and Perception: Attention and perception normal.        Mood and Affect: Mood and affect normal.        Speech: Speech normal.        Behavior: Behavior normal. Behavior is cooperative.        Thought Content: Thought content normal.         Cognition and Memory: Cognition and memory normal.        Judgment: Judgment normal.     Last depression screening scores    10/10/2023    3:34 PM 10/03/2022    3:07 PM 09/07/2021    2:06 PM  PHQ 2/9 Scores  PHQ - 2 Score 0 0 0  PHQ- 9 Score   0   Last fall risk screening    10/10/2023    3:34 PM  Fall Risk   Falls in the past year? 0  Number falls in past yr: 0  Injury with Fall? 0  Risk for fall due to : No Fall Risks   Last Audit-C alcohol use screening    10/03/2022    3:07 PM  Alcohol Use Disorder Test (AUDIT)  1. How often do you have a drink containing alcohol? 0  2. How many drinks containing alcohol do you have on a typical day when you are drinking? 0  3. How often do you have six or more drinks on one occasion? 0  AUDIT-C Score 0   A score of 3 or more in women, and 4 or more in men indicates increased risk for alcohol abuse, EXCEPT if all of the points are from question 1   No results found for any visits on 10/10/23.  Assessment & Plan    Routine Health Maintenance and Physical Exam  Exercise Activities and Dietary recommendations  Goals   None     Immunization History  Administered Date(s) Administered   Influenza, Seasonal, Injecte, Preservative Fre 10/10/2023   Influenza,inj,Quad PF,6+ Mos 10/10/2015, 08/04/2017, 08/27/2018, 08/30/2019, 09/04/2020, 09/07/2021, 10/03/2022   PFIZER(Purple Top)SARS-COV-2 Vaccination 07/26/2020, 08/16/2020   Tdap 04/07/2008, 02/20/2018   Zoster Recombinant(Shingrix) 09/04/2020, 11/06/2020    Health Maintenance  Topic Date Due   Cervical Cancer Screening (HPV/Pap Cotest)  08/27/2021   COVID-19 Vaccine (3 - 2023-24 season) 08/10/2023   MAMMOGRAM  11/29/2024   DTaP/Tdap/Td (3 - Td or Tdap) 02/21/2028   Colonoscopy  10/12/2028   INFLUENZA VACCINE  Completed   Hepatitis C Screening  Completed   HIV Screening  Completed   Zoster Vaccines- Shingrix  Completed   HPV VACCINES  Aged Out    Discussed health  benefits of physical activity, and encouraged her to engage in regular exercise appropriate for her age and condition.  Problem List Items Addressed This Visit       Hematopoietic and Hemostatic   Aplastic anemia (HCC)   Other Visit Diagnoses     Immunization due    -  Primary   Relevant Orders   Flu vaccine trivalent PF, 6mos and older(Flulaval,Afluria,Fluarix,Fluzone) (Completed)   Pap smear for cervical cancer screening       Relevant Orders   Cytology - PAP (Completed)   Annual physical exam       Encounter for screening mammogram for malignant neoplasm of breast  Relevant Orders   MM 3D SCREENING MAMMOGRAM BILATERAL BREAST      Annual Well Woman Exam Normal physical exam including breast and pelvic exam. Pap smear performed with HPV co-testing. No STI screening requested by patient. - Await Pap smear results. If normal, repeat in 5 years.  Mammogram Due in December. - Patient to schedule mammogram at [facility].  Ear Wax Impaction, Minor Patient reports ear feels clogged. Wax visualized on exam. - Recommended over-the-counter ear wax removal drops and irrigation at home per pt preference -May benefit from use of nasal steroid to assist   Thyroid Disorder Well controlled on current medication. - Continue current thyroid medication with refills until February.  Iron Deficiency Well controlled on current medication. - Continue current iron supplementation.  Hyperlipidemia Improved cholesterol levels. - Continue current management.  Pre-Diabetes Well controlled A1C. - Continue current management.  Colon Cancer Screening Last colonoscopy performed in November 2019. - Next colonoscopy due in November 2024.  COVID-19 Vaccination Eligible for vaccination. - Patient to consider COVID-19 vaccination.  General Health Maintenance Up-to-date with dentist. Eye exam due in next 6 months. - Patient to schedule eye exam in next 6 months.  Leilani Merl, FNP,  have reviewed all documentation for this visit. The documentation on 10/16/23 for the exam, diagnosis, procedures, and orders are all accurate and complete.  Jacky Kindle, FNP  [facility] [phone] (phone) [phone] (fax)  [facility]

## 2023-10-10 NOTE — Patient Instructions (Signed)
Please call and schedule your mammogram:  [facility]  [address], [address] [facility] [place],  [place]  [address] Get Driving Directions  Sunday:Closed Monday:7:20 AM - 5:00 PM Tuesday:7:20 AM - 5:00 PM Wednesday:7:20 AM - 5:00 PM Thursday:7:20 AM - 5:00 PM Friday:7:20 AM - 4:30 PM Saturday:Closed

## 2023-10-16 DIAGNOSIS — Z124 Encounter for screening for malignant neoplasm of cervix: Secondary | ICD-10-CM | POA: Insufficient documentation

## 2023-10-16 DIAGNOSIS — Z23 Encounter for immunization: Secondary | ICD-10-CM | POA: Insufficient documentation

## 2023-10-16 LAB — CYTOLOGY - PAP
Comment: NEGATIVE
Diagnosis: NEGATIVE
High risk HPV: NEGATIVE

## 2024-03-18 ENCOUNTER — Telehealth: Payer: Self-pay | Admitting: Physician Assistant

## 2024-03-18 DIAGNOSIS — E039 Hypothyroidism, unspecified: Secondary | ICD-10-CM

## 2024-03-18 NOTE — Telephone Encounter (Signed)
 [facility] is requesting refill levothyroxine (EUTHYROX) 75 MCG tablet   Please advise

## 2024-03-22 MED ORDER — LEVOTHYROXINE SODIUM 75 MCG PO TABS: 75.0000 ug | ORAL_TABLET | Freq: Every day | ORAL | 0 refills | Status: DC

## 2024-04-05 ENCOUNTER — Encounter: Payer: Self-pay | Admitting: Family Medicine

## 2024-04-05 ENCOUNTER — Ambulatory Visit (INDEPENDENT_AMBULATORY_CARE_PROVIDER_SITE_OTHER): Admitting: Family Medicine

## 2024-04-05 VITALS — BP 130/76 | HR 70 | Ht 63.0 in | Wt 144.0 lb

## 2024-04-05 DIAGNOSIS — D508 Other iron deficiency anemias: Secondary | ICD-10-CM | POA: Diagnosis not present

## 2024-04-05 DIAGNOSIS — E039 Hypothyroidism, unspecified: Secondary | ICD-10-CM | POA: Diagnosis not present

## 2024-04-05 DIAGNOSIS — E7800 Pure hypercholesterolemia, unspecified: Secondary | ICD-10-CM

## 2024-04-05 MED ORDER — LEVOTHYROXINE SODIUM 75 MCG PO TABS
75.0000 ug | ORAL_TABLET | Freq: Every day | ORAL | 2 refills | Status: AC
Start: 2024-04-05 — End: ?

## 2024-04-05 NOTE — Assessment & Plan Note (Signed)
 Chronic, stable  Iron deficiency anemia, managed with ferrous sulfate  supplementation. No new symptoms reported. - Continue ferrous sulfate  325 mg daily  - plan to recheck CBC in Nov for CPE

## 2024-04-05 NOTE — Assessment & Plan Note (Signed)
 Chronic  No current statin therapy  Continue lifestyle management Will recheck lipid levels during CPE In Nov 2025

## 2024-04-05 NOTE — Assessment & Plan Note (Signed)
 Chronic hypothyroidism, well-managed with levothyroxine  and thyroxine. She reports feeling well with the current treatment regimen. - Refill levothyroxine  and thyroxine prescriptions. - Recheck TSH and T4 levels in November.

## 2024-04-05 NOTE — Progress Notes (Signed)
 Established patient visit   Patient: Karen Randolph   DOB: 11-Apr-1966   58 y.o. Female  MRN: [id number] Visit Date: 04/05/2024  Today's healthcare provider: Mimi Alt, MD   Chief Complaint  Patient presents with   Medication Refill    Needs meds refilled    Establish Care    No concerns    Subjective     HPI     Medication Refill    Additional comments: Needs meds refilled         Establish Care    Additional comments: No concerns       Last edited by Bart Lieu, CMA on 04/05/2024  1:12 PM.       Discussed the use of AI scribe software for clinical note transcription with the patient, who gave verbal consent to proceed.  History of Present Illness Karen Randolph is a 58 year old female with hypothyroidism and iron deficiency anemia who presents for a chronic follow-up.  She has a history of hypothyroidism and is currently taking levothyroxine  and thyroxine daily. She feels well overall and has been maintaining her medication regimen regularly. No acute concerns are noted regarding her thyroid condition.  She also has a history of iron deficiency anemia and is taking ferrous sulfate  325 mg once a week. She continues to take this medication as prescribed and has no acute concerns related to her anemia.  She has been exercising regularly and working on her cholesterol levels. She last saw her previous provider approximately five to six months ago and has been stable since then.     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   Multiple Vitamin (MULTIVITAMIN) tablet Take 1 tablet by mouth daily.   OMEGA-3 FATTY ACIDS PO 1 tablet daily.   Vitamin D , Cholecalciferol, 400 UNITS TABS Take 1 tablet by mouth daily.    [DISCONTINUED] levothyroxine  (EUTHYROX ) 75 MCG tablet Take 1 tablet (75 mcg total) by mouth daily before breakfast.   No facility-administered medications prior to visit.    Review of Systems  Last metabolic panel Lab Results  Component Value Date   GLUCOSE 87 07/28/2023   NA 141 07/28/2023   K 4.4 07/28/2023   CL 103 07/28/2023   CO2 24 07/28/2023   BUN 22 07/28/2023   CREATININE 0.87 07/28/2023   EGFR 79 07/28/2023   CALCIUM 10.0 07/28/2023   PROT 7.1 07/28/2023   ALBUMIN 4.4 07/28/2023   LABGLOB 2.7 07/28/2023   AGRATIO 1.7 10/03/2022   BILITOT 0.2 07/28/2023   ALKPHOS 78 07/28/2023   AST 18 07/28/2023   ALT 20 07/28/2023   Last lipids Lab Results  Component Value Date   CHOL 233 (H) 07/28/2023   HDL 106 07/28/2023   LDLCALC 116 (H) 07/28/2023   TRIG 67 07/28/2023   CHOLHDL 2.2 07/28/2023   Last hemoglobin A1c Lab Results  Component Value Date   HGBA1C 5.5 10/03/2022   Last thyroid functions Lab Results  Component Value Date   TSH 0.916 07/28/2023   T4TOTAL 7.1 08/08/2021        Objective    BP 130/76   Pulse 70   Ht 5\' 3"  (1.6 m)   Wt 144 lb (65.3  kg)   LMP 06/08/2020   SpO2 100%   BMI 25.51 kg/m  BP Readings from Last 3 Encounters:  04/05/24 130/76  10/10/23 115/74  07/28/23 126/77   Wt Readings from Last 3 Encounters:  04/05/24 144 lb (65.3 kg)  10/10/23 138 lb 8 oz (62.8 kg)  07/28/23 136 lb 9.6 oz (62 kg)        Physical Exam  General: Alert, no acute distress Neck: no thyromegaly, no thyroid tenderness to palpation Cardio: Normal S1 and S2, RRR, no r/m/g Pulm: CTAB, normal work of breathing    No results found for any visits on 04/05/24.  Assessment & Plan     Problem List Items Addressed This Visit       Endocrine   Hypothyroidism   Chronic hypothyroidism, well-managed with levothyroxine  and thyroxine. She reports feeling well with the current treatment regimen. - Refill levothyroxine  and thyroxine prescriptions. -  Recheck TSH and T4 levels in November.      Relevant Medications   levothyroxine  (EUTHYROX ) 75 MCG tablet     Other   Iron deficiency anemia - Primary   Chronic, stable  Iron deficiency anemia, managed with ferrous sulfate  supplementation. No new symptoms reported. - Continue ferrous sulfate  325 mg daily  - plan to recheck CBC in Nov for CPE       Elevated LDL cholesterol level   Chronic  No current statin therapy  Continue lifestyle management Will recheck lipid levels during CPE In Nov 2025       Assessment & Plan      Return in about 7 months (around 11/05/2024) for CPE.         Mimi Alt, MD  [facility] [phone] (phone) [phone] (fax)  [facility]

## 2024-06-28 ENCOUNTER — Ambulatory Visit: Payer: Self-pay

## 2024-06-28 NOTE — Telephone Encounter (Signed)
 FYI Only or Action Required?: FYI only for provider.  Patient was last seen in primary care on 04/05/2024 by Sharma Coyer, MD.  Called Nurse Triage reporting Back Pain.  Symptoms began about a month ago.  Interventions attempted: OTC medications: Ibuprofen.  Symptoms are: gradually worsening.  Triage Disposition: See PCP Within 2 Weeks  Patient/caregiver understands and will follow disposition?: Yes    Copied from CRM [id number]. Topic: Clinical - Red Word Triage >> Jun 28, 2024  4:06 PM Ivette P wrote: Red Word that prompted transfer to Nurse Triage: Fatigue, fairly recent, occurring within 4-6 weeks.    Back pain - started at the same time.      Reason for Disposition  Back pain present > 2 weeks  Answer Assessment - Initial Assessment Questions 1. ONSET: When did the pain begin? (e.g., minutes, hours, days)     4-6 weeks 2. LOCATION: Where does it hurt? (upper, mid or lower back)     Bilateral lower back  3. SEVERITY: How bad is the pain?  (e.g., Scale 1-10; mild, moderate, or severe)     6/10 4. PATTERN: Is the pain constant? (e.g., yes, no; constant, intermittent)      Constant 5. RADIATION: Does the pain shoot into your legs or somewhere else?     No 6. CAUSE:  What do you think is causing the back pain?      Unsure 7. BACK OVERUSE:  Any recent lifting of heavy objects, strenuous work or exercise?     No 8. MEDICINES: What have you taken so far for the pain? (e.g., nothing, acetaminophen, NSAIDS)     Ibuprofen  9. NEUROLOGIC SYMPTOMS: Do you have any weakness, numbness, or problems with bowel/bladder control?     No 10. OTHER SYMPTOMS: Do you have any other symptoms? (e.g., fever, abdomen pain, burning with urination, blood in urine)       Increased fatigue  Protocols used: Back Pain-A-AH

## 2024-07-02 ENCOUNTER — Ambulatory Visit
Admission: RE | Admit: 2024-07-02 | Discharge: 2024-07-02 | Disposition: A | Discharge status: Home / Self Care | Attending: Physician Assistant | Admitting: Physician Assistant

## 2024-07-02 ENCOUNTER — Ambulatory Visit
Admission: RE | Admit: 2024-07-02 | Discharge: 2024-07-02 | Disposition: A | Discharge status: Home / Self Care | Source: Ambulatory Visit | Attending: Physician Assistant | Admitting: Physician Assistant

## 2024-07-02 ENCOUNTER — Encounter: Payer: Self-pay | Admitting: Physician Assistant

## 2024-07-02 ENCOUNTER — Ambulatory Visit (INDEPENDENT_AMBULATORY_CARE_PROVIDER_SITE_OTHER): Admitting: Physician Assistant

## 2024-07-02 VITALS — BP 114/76 | HR 80 | Ht 63.0 in | Wt 147.1 lb

## 2024-07-02 DIAGNOSIS — M549 Dorsalgia, unspecified: Secondary | ICD-10-CM

## 2024-07-02 DIAGNOSIS — D508 Other iron deficiency anemias: Secondary | ICD-10-CM | POA: Diagnosis not present

## 2024-07-03 LAB — POCT URINALYSIS DIPSTICK

## 2024-07-03 NOTE — Progress Notes (Signed)
 Established patient visit  Patient: Karen Randolph   DOB: 1966-05-12   58 y.o. Female  MRN: [id number] Visit Date: 07/02/2024  Today's healthcare provider: Jolynn Spencer, PA-C   Chief Complaint  Patient presents with   Back Pain    Patient presents for evaluation of low back problems. Symptoms have been present for 4-6 weeks and describes and dull constant pain. Symptoms are worse in the morning. Alleviating factors identifiable by the patient are none. Aggravating factors identifiable by the patient are standing after sitting for extended periods. Treatments initiated by the patient: acetaminophen and ibuprofen. Previous lower back problems: yes several years ago. Hurts to the point she can't stand and then goes away    Subjective     HPI     Back Pain    Additional comments: Patient presents for evaluation of low back problems. Symptoms have been present for 4-6 weeks and describes and dull constant pain. Symptoms are worse in the morning. Alleviating factors identifiable by the patient are none. Aggravating factors identifiable by the patient are standing after sitting for extended periods. Treatments initiated by the patient: acetaminophen and ibuprofen. Previous lower back problems: yes several years ago. Hurts to the point she can't stand and then goes away       Last edited by Lilian Fitzpatrick, CMA on 07/02/2024  3:04 PM.       Discussed the use of AI scribe software for clinical note transcription with the patient, who gave verbal consent to proceed.  History of Present Illness Karen Randolph is a 58 year old female who presents with low back pain.  She experiences a dull, constant ache in the lower back for the past four to six weeks without any recent trauma or specific events triggering the pain. Acetaminophen and ibuprofen have not provided relief. Heat application offers some relief. Her exercise routine is reduced due to the pain.  She denies fever, flank pain,  urinary issues, vaginal discharge, and kidney problems. She has a numb sensation in her right toe, which is typical for her and unrelated to the current back pain.       07/02/2024    3:05 PM 04/05/2024    1:12 PM 10/10/2023    3:34 PM  Depression screen PHQ 2/9  Decreased Interest 0 0 0  Down, Depressed, Hopeless 0 0 0  PHQ - 2 Score 0 0 0      07/02/2024    3:05 PM 04/05/2024    1:12 PM 10/10/2023    3:34 PM  GAD 7 : Generalized Anxiety Score  Nervous, Anxious, on Edge 0 0 0  Control/stop worrying 0 0 0  Worry too much - different things 0 0 0  Trouble relaxing 0 0 0  Restless 0 0 0  Easily annoyed or irritable 0 0 0  Afraid - awful might happen 0 0 0  Total GAD 7 Score 0 0 0  Anxiety Difficulty Not difficult at all Not difficult at all Not difficult at all    Medications: Outpatient Medications Prior to Visit  Medication Sig   ferrous sulfate  325 (65 FE) MG tablet Take 1 tablet (325 mg total) by mouth daily.   levothyroxine  (EUTHYROX ) 75 MCG tablet Take 1 tablet (75 mcg total) by mouth daily before breakfast.   Multiple Vitamin (MULTIVITAMIN) tablet Take 1 tablet by mouth daily.   OMEGA-3 FATTY ACIDS PO 1 tablet daily.   Vitamin D , Cholecalciferol, 400 UNITS TABS Take 1 tablet by mouth daily.  No facility-administered medications prior to visit.    Review of Systems All negative Except see HPI       Objective    BP 114/76 (BP Location: Left Arm, Patient Position: Sitting)   Pulse 80   Ht 5' 3 (1.6 m)   Wt 147 lb 1.6 oz (66.7 kg)   LMP 06/08/2020   SpO2 100%   BMI 26.06 kg/m     Physical Exam Constitutional:      General: She is not in acute distress.    Appearance: Normal appearance.  HENT:     Head: Normocephalic.  Pulmonary:     Effort: Pulmonary effort is normal. No respiratory distress.  Abdominal:     Tenderness: There is no right CVA tenderness or left CVA tenderness.  Musculoskeletal:        General: Tenderness (lower back) present. No  swelling. Normal range of motion.  Neurological:     Mental Status: She is alert and oriented to person, place, and time. Mental status is at baseline.      No results found for any visits on 07/02/24.      Assessment & Plan Low Back Pain Chronic low back pain without trauma or neurological deficits. X-ray needed before MRI due to insurance. - Order lumbar spine X-ray. - Perform urinalysis to rule out renal causes. POCT ua dipstick negative - Advise heat therapy and Voltaren gel for analgesia. - Encourage acetaminophen and ibuprofen as needed. - Recommend gentle exercise as tolerated, avoiding exacerbating activities. Consider orthopedics Will follow-up  Iron Deficiency Anemia Chronic Iron deficiency anemia managed with iron supplements per hematologist. - Continue iron supplementation as advised by hematologist.  General Health Maintenance Emphasized maintaining mobility and exercise to prevent musculoskeletal issues. - Encourage regular exercise within pain limits.   Orders Placed This Encounter  Procedures   DG Lumbar Spine Complete    Standing Status:   Future    Number of Occurrences:   1    Expected Date:   07/02/2024    Expiration Date:   08/02/2024    Reason for Exam (SYMPTOM  OR DIAGNOSIS REQUIRED):   lower back pain    Is the patient pregnant?:   No    Preferred imaging location?:   [facility]   POCT urinalysis dipstick    No follow-ups on file.   The patient was advised to call back or seek an in-person evaluation if the symptoms worsen or if the condition fails to improve as anticipated.  I discussed the assessment and treatment plan with the patient. The patient was provided an opportunity to ask questions and all were answered. The patient agreed with the plan and demonstrated an understanding of the instructions.  I, Othella Slappey, PA-C have reviewed all documentation for this visit. The documentation on 07/02/2024  for the exam, diagnosis,  procedures, and orders are all accurate and complete.  Jolynn Spencer, [facility], MMS [facility] [phone] (phone) [phone] (fax)  [facility]

## 2024-07-13 ENCOUNTER — Ambulatory Visit: Payer: Self-pay | Admitting: Physician Assistant

## 2024-07-16 ENCOUNTER — Telehealth: Payer: Self-pay | Admitting: Family Medicine

## 2024-07-16 NOTE — Telephone Encounter (Signed)
 Patient uses [facility].    She has been on Euthyrox  for 8-10 years.  They have told her its being discontinued.   [facility] .  Patient wants to know if there are any differences in the medications and will this be ok for her to take.

## 2024-07-19 NOTE — Telephone Encounter (Signed)
 Euthyrox  and Synthroid  are just 2 different brand names for levothyroxine , so the same basic medication.  Ok to stay on same dose. Can recheck TSH at next visit to ensure no change in control.

## 2024-07-19 NOTE — Telephone Encounter (Signed)
 Advised

## 2024-10-26 ENCOUNTER — Telehealth: Payer: Self-pay | Admitting: Family Medicine

## 2024-10-26 NOTE — Telephone Encounter (Unsigned)
 Copied from CRM [id number]. Topic: Clinical - Medication Refill >> Oct 26, 2024  1:51 PM Montie POUR wrote: Medication:  levothyroxine  (SYNTHROID ) 50 MCG tablet The are no longer making levothyroxine  (EUTHYROX ) 75 MCG tablet  Has the patient contacted their pharmacy? Yes (Agent: If no, request that the patient contact the pharmacy for the refill. If patient does not wish to contact the pharmacy document the reason why and proceed with request.) (Agent: If yes, when and what did the pharmacy advise?) Pharmacy needs order to refill  This is the patient's preferred pharmacy:  [facility] [place], [place] - [address] [facility] Portal to [facility] [address] [place] [address] Phone: [phone] Fax: [phone]  Is this the correct pharmacy for this prescription? Yes If no, delete pharmacy and type the correct one.   Has the prescription been filled recently? No  Is the patient out of the medication? No - She has 7 days left  Has the patient been seen for an appointment in the last year OR does the patient have an upcoming appointment? Yes  Can we respond through MyChart? Yes  Agent: Please be advised that Rx refills may take up to 3 business days. We ask that you follow-up with your pharmacy.

## 2024-10-27 NOTE — Telephone Encounter (Signed)
 Called pt to inform she should contact pharmacy. Pt has enough refills to last til January messaged left in her vm, no answer when called.

## 2024-11-08 ENCOUNTER — Encounter: Payer: Self-pay | Admitting: Family Medicine

## 2024-11-08 ENCOUNTER — Ambulatory Visit: Admitting: Family Medicine

## 2024-11-08 VITALS — BP 128/83 | HR 68 | Temp 97.9°F | Ht 63.0 in | Wt 152.0 lb

## 2024-11-08 DIAGNOSIS — Z0000 Encounter for general adult medical examination without abnormal findings: Secondary | ICD-10-CM

## 2024-11-08 DIAGNOSIS — Z131 Encounter for screening for diabetes mellitus: Secondary | ICD-10-CM

## 2024-11-08 DIAGNOSIS — Z1231 Encounter for screening mammogram for malignant neoplasm of breast: Secondary | ICD-10-CM

## 2024-11-08 DIAGNOSIS — E559 Vitamin D deficiency, unspecified: Secondary | ICD-10-CM

## 2024-11-08 DIAGNOSIS — R7989 Other specified abnormal findings of blood chemistry: Secondary | ICD-10-CM

## 2024-11-08 DIAGNOSIS — E039 Hypothyroidism, unspecified: Secondary | ICD-10-CM

## 2024-11-08 DIAGNOSIS — E7800 Pure hypercholesterolemia, unspecified: Secondary | ICD-10-CM

## 2024-11-08 DIAGNOSIS — D508 Other iron deficiency anemias: Secondary | ICD-10-CM

## 2024-11-08 DIAGNOSIS — Z23 Encounter for immunization: Secondary | ICD-10-CM

## 2024-11-08 NOTE — Patient Instructions (Signed)
   To keep you healthy, please keep in mind the following health maintenance items that you are due for:   Health Maintenance Due  Topic Date Due   Hepatitis B Vaccines 19-59 Average Risk (1 of 3 - 19+ 3-dose series) Never done   Pneumococcal Vaccine: 50+ Years (1 of 1 - PCV) Never done   Influenza Vaccine  07/09/2024   COVID-19 Vaccine (3 - 2025-26 season) 08/09/2024   Mammogram  11/29/2024     Best Wishes,   Dr. Lang

## 2024-11-08 NOTE — Progress Notes (Signed)
 Complete physical exam   Patient: Karen Randolph   DOB: Jul 15, 1966   58 y.o. Female  MRN: [id number] Visit Date: 11/08/2024  Today's healthcare provider: Rockie Agent, MD   Chief Complaint  Patient presents with   Annual Exam    Patient is present for annual exam with PCP.  Diet is normal per patient. Doing exercise 5-6 x weekly at gym with weights and running  Will complete flu vaccine and mammogram    Subjective    Karen Randolph is a 58 y.o. female who presents today for a complete physical exam.    She does not have additional problems to discuss today.   Discussed the use of AI scribe software for clinical note transcription with the patient, who gave verbal consent to proceed.  History of Present Illness Karen Randolph is a 58 year old female who presents for an annual physical exam.  She exercises five to six times per week at the gym, engaging in both weight training and running. Despite her regular exercise routine, she has noticed weight gain. She attributes this to her diet and the use of creatine supplements, although she feels stronger and is toning up. She wants to increase her strength further.  She has a history of iron deficiency anemia and hypothyroidism. She is currently taking creatine and an organic vegan protein powder, adding an extra scoop of protein to her breakfast. She recalls a previous instance where her liver enzymes were elevated, which she attributes to high protein intake at the time. However, her liver and kidney function tests were normal last year.  No complaints other than weight gain despite regular exercise.     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   Thyroid disease    Past Surgical History:  Procedure Laterality Date   BRAIN TUMOR EXCISION  2007,2010   COLONOSCOPY WITH PROPOFOL  N/A 10/12/2018   Procedure: COLONOSCOPY WITH PROPOFOL ;  Surgeon: Therisa Bi, MD;   Location: [facility] ENDOSCOPY;  Service: Gastroenterology;  Laterality: N/A;   Social History   Socioeconomic History   Marital status: Single    Spouse name: Not on file   Number of children: Not on file   Years of education: Not on file   Highest education level: Professional school degree (e.g., MD, DDS, DVM, JD)  Occupational History   Not on file  Tobacco Use   Smoking status: Never   Smokeless tobacco: Never  Vaping Use   Vaping status: Never Used  Substance and Sexual Activity   Alcohol use: Never   Drug use: Never   Sexual activity: Not Currently  Other Topics Concern   Not on file  Social History Narrative   Not on file   Social Drivers of Health   Financial Resource Strain: Patient Declined (11/07/2024)   Overall Financial Resource Strain (CARDIA)    Difficulty of Paying Living Expenses: Patient declined  Food Insecurity: Patient Declined (11/07/2024)   Hunger Vital Sign    Worried About Running Out of Food in the Last Year: Patient declined    Ran Out of Food in the Last Year: Patient declined  Transportation Needs: Patient Declined (11/07/2024)   PRAPARE - Administrator, Civil Service (Medical): Patient declined    Lack of Transportation (Non-Medical): Patient declined  Physical Activity: Sufficiently Active (11/07/2024)   Exercise Vital Sign    Days of Exercise per Week: 5 days  Minutes of Exercise per Session: 40 min  Stress: No Stress Concern Present (11/07/2024)   [employer] of Occupational Health - Occupational Stress Questionnaire    Feeling of Stress: Not at all  Social Connections: Unknown (11/07/2024)   Social Connection and Isolation Panel    Frequency of Communication with Friends and Family: Patient declined    Frequency of Social Gatherings with Friends and Family: Patient declined    Attends Religious Services: Patient declined    Database Administrator or Organizations: Patient declined    Attends Banker  Meetings: Not on file    Marital Status: Patient declined  Intimate Partner Violence: Not At Risk (04/05/2024)   Humiliation, Afraid, Rape, and Kick questionnaire    Fear of Current or Ex-Partner: No    Emotionally Abused: No    Physically Abused: No    Sexually Abused: No   Family Status  Relation Name Status   Mother  Deceased at age 18   Father father Deceased at age 20   MGM  Deceased   MGF  Deceased   PGM  Deceased   PGF  Deceased   Brother  (Not Specified)  No partnership data on file   Family History  Problem Relation Age of Onset   Alcohol abuse Mother    CVA Father    CAD Father    Hypertension Father    Heart attack Father    Healthy Brother    Allergies  Allergen Reactions   Shellfish Allergy      Medications: Outpatient Medications Prior to Visit  Medication Sig   ferrous sulfate  325 (65 FE) MG tablet Take 1 tablet (325 mg total) by mouth daily.   levothyroxine  (EUTHYROX ) 75 MCG tablet Take 1 tablet (75 mcg total) by mouth daily before breakfast.   Multiple Vitamin (MULTIVITAMIN) tablet Take 1 tablet by mouth daily.   OMEGA-3 FATTY ACIDS PO 1 tablet daily.   Vitamin D , Cholecalciferol, 400 UNITS TABS Take 1 tablet by mouth daily.   No facility-administered medications prior to visit.    Review of Systems  Last CBC Lab Results  Component Value Date   WBC 4.2 07/28/2023   HGB 14.3 07/28/2023   HCT 41.6 07/28/2023   MCV 94 07/28/2023   MCH 32.4 07/28/2023   RDW 11.8 07/28/2023   PLT 267 07/28/2023   Last metabolic panel Lab Results  Component Value Date   GLUCOSE 87 07/28/2023   NA 141 07/28/2023   K 4.4 07/28/2023   CL 103 07/28/2023   CO2 24 07/28/2023   BUN 22 07/28/2023   CREATININE 0.87 07/28/2023   EGFR 79 07/28/2023   CALCIUM 10.0 07/28/2023   PROT 7.1 07/28/2023   ALBUMIN 4.4 07/28/2023   LABGLOB 2.7 07/28/2023   AGRATIO 1.7 10/03/2022   BILITOT 0.2 07/28/2023   ALKPHOS 78 07/28/2023   AST 18 07/28/2023   ALT 20 07/28/2023    Last lipids Lab Results  Component Value Date   CHOL 233 (H) 07/28/2023   HDL 106 07/28/2023   LDLCALC 116 (H) 07/28/2023   TRIG 67 07/28/2023   CHOLHDL 2.2 07/28/2023   Last hemoglobin A1c Lab Results  Component Value Date   HGBA1C 5.5 10/03/2022   Last thyroid functions Lab Results  Component Value Date   TSH 0.916 07/28/2023   T4TOTAL 7.1 08/08/2021   FREET4 1.25 10/03/2022   Last vitamin D  Lab Results  Component Value Date   VD25OH 82.2 10/03/2022   Last vitamin B12  and Folate Lab Results  Component Value Date   VITAMINB12 1,452 (H) 05/26/2020   FOLATE 26.0 09/24/2019       Objective    BP 128/83 (BP Location: Right Arm, Patient Position: Sitting, Cuff Size: Normal)   Pulse 68   Temp 97.9 F (36.6 C) (Oral)   Ht 5' 3 (1.6 m)   Wt 151 lb 15.7 oz (68.9 kg)   LMP 06/08/2020   SpO2 100%   BMI 26.92 kg/m   BP Readings from Last 3 Encounters:  11/08/24 128/83  07/02/24 114/76  04/05/24 130/76   Wt Readings from Last 3 Encounters:  11/08/24 151 lb 15.7 oz (68.9 kg)  07/02/24 147 lb 1.6 oz (66.7 kg)  04/05/24 144 lb (65.3 kg)        Physical Exam Vitals reviewed.  Constitutional:      General: She is not in acute distress.    Appearance: Normal appearance. She is not ill-appearing, toxic-appearing or diaphoretic.  HENT:     Head: Normocephalic and atraumatic.     Right Ear: Tympanic membrane and external ear normal. There is no impacted cerumen.     Left Ear: Tympanic membrane and external ear normal. There is no impacted cerumen.     Nose: Nose normal.     Mouth/Throat:     Pharynx: Oropharynx is clear.  Eyes:     General: No scleral icterus.    Extraocular Movements: Extraocular movements intact.     Conjunctiva/sclera: Conjunctivae normal.     Pupils: Pupils are equal, round, and reactive to light.  Cardiovascular:     Rate and Rhythm: Normal rate and regular rhythm.     Pulses: Normal pulses.     Heart sounds: Normal heart sounds.  No murmur heard.    No friction rub. No gallop.  Pulmonary:     Effort: Pulmonary effort is normal. No respiratory distress.     Breath sounds: Normal breath sounds. No wheezing, rhonchi or rales.  Abdominal:     General: Bowel sounds are normal. There is no distension.     Palpations: Abdomen is soft. There is no mass.     Tenderness: There is no abdominal tenderness. There is no guarding.  Musculoskeletal:        General: No deformity.     Cervical back: Normal range of motion and neck supple.     Right lower leg: No edema.     Left lower leg: No edema.  Lymphadenopathy:     Cervical: No cervical adenopathy.  Skin:    General: Skin is warm.     Capillary Refill: Capillary refill takes less than 2 seconds.     Findings: No erythema or rash.  Neurological:     General: No focal deficit present.     Mental Status: She is alert and oriented to person, place, and time.     Cranial Nerves: Cranial nerves 2-12 are intact. No cranial nerve deficit or facial asymmetry.     Motor: Motor function is intact. No weakness.     Gait: Gait normal.  Psychiatric:        Mood and Affect: Mood normal.        Behavior: Behavior normal.       Last depression screening scores    07/02/2024    3:05 PM 04/05/2024    1:12 PM 10/10/2023    3:34 PM  PHQ 2/9 Scores  PHQ - 2 Score 0 0 0    Last fall risk  screening    07/02/2024    3:05 PM  Fall Risk   Falls in the past year? 0  Number falls in past yr: 0  Injury with Fall? 0  Risk for fall due to : No Fall Risks    Last Audit-C alcohol use screening    11/07/2024    2:03 PM  Alcohol Use Disorder Test (AUDIT)  1. How often do you have a drink containing alcohol? 0   A score of 3 or more in women, and 4 or more in men indicates increased risk for alcohol abuse, EXCEPT if all of the points are from question 1   No results found for any visits on 11/08/24.  Assessment & Plan    Routine Health Maintenance and Physical  Exam  Immunization History  Administered Date(s) Administered   Influenza, Seasonal, Injecte, Preservative Fre 10/10/2023   Influenza,inj,Quad PF,6+ Mos 10/10/2015, 08/04/2017, 08/27/2018, 08/30/2019, 09/04/2020, 09/07/2021, 10/03/2022   PFIZER(Purple Top)SARS-COV-2 Vaccination 07/26/2020, 08/16/2020   Tdap 04/07/2008, 02/20/2018   Zoster Recombinant(Shingrix) 09/04/2020, 11/06/2020    Health Maintenance  Topic Date Due   Hepatitis B Vaccines 19-59 Average Risk (1 of 3 - 19+ 3-dose series) Never done   Pneumococcal Vaccine: 50+ Years (1 of 1 - PCV) Never done   Influenza Vaccine  07/09/2024   Mammogram  11/29/2024   COVID-19 Vaccine (3 - 2025-26 season) 11/24/2024 (Originally 08/09/2024)   DTaP/Tdap/Td (3 - Td or Tdap) 02/21/2028   Cervical Cancer Screening (HPV/Pap Cotest)  10/09/2028   Colonoscopy  10/12/2028   Hepatitis C Screening  Completed   HIV Screening  Completed   Zoster Vaccines- Shingrix  Completed   HPV VACCINES  Aged Out   Meningococcal B Vaccine  Aged Out    Problem List Items Addressed This Visit     Annual physical exam - Primary   Elevated LDL cholesterol level   Relevant Orders   Lipid panel   Elevated liver function tests   Relevant Orders   CMP14+EGFR   Encounter for screening mammogram for malignant neoplasm of breast   Relevant Orders   MM 3D SCREENING MAMMOGRAM BILATERAL BREAST   Hypothyroidism   Relevant Orders   TSH + free T4   Immunization due   Relevant Orders   Flu vaccine trivalent PF, 6mos and older(Flulaval,Afluria,Fluarix,Fluzone)   Iron deficiency anemia   Relevant Orders   CBC   Other Visit Diagnoses       Screening for diabetes mellitus       Relevant Orders   Hemoglobin A1c     Elevated vitamin B12 level       Relevant Orders   Vitamin B12     Vitamin D  deficiency       Relevant Orders   VITAMIN D  25 Hydroxy (Vit-D Deficiency, Fractures)       Assessment and Plan Assessment & Plan Adult Wellness Visit Annual  physical examination with no acute complaints. Reports weight gain despite regular exercise and dietary efforts, possibly related to creatine supplementation and muscle gain. Engages in regular exercise and a balanced diet. - Continue regular exercise and balanced diet - Ordered A1c for diabetes screening - Ordered CBC for anemia screening - Ordered CMP for liver function - Ordered TSH and T4 for thyroid function - Ordered vitamin B12 and vitamin D  levels - Ordered mammogram - Administered influenza vaccine  Hypothyroidism Thyroid function to be assessed with TSH and T4 levels. - Ordered TSH and T4 levels  Iron deficiency anemia Follow-up for iron  deficiency anemia with CBC ordered to assess current status. - Ordered CBC for anemia screening  Vitamin D  deficiency Vitamin D  levels to be assessed. - Ordered vitamin D  level  Pure hypercholesterolemia Cholesterol levels to be assessed as part of routine screening. - Ordered lipid panel       Return in about 1 year (around 11/09/2025) for CPE.       Rockie Agent, MD  [facility] [phone] (phone) [phone] (fax)  [facility]

## 2024-11-09 ENCOUNTER — Ambulatory Visit: Payer: Self-pay | Admitting: Family Medicine

## 2024-11-09 DIAGNOSIS — R7401 Elevation of levels of liver transaminase levels: Secondary | ICD-10-CM

## 2024-11-09 LAB — LIPID PANEL
Chol/HDL Ratio: 2 ratio (ref 0.0–4.4)
Cholesterol, Total: 259 mg/dL — ABNORMAL HIGH (ref 100–199)
HDL: 132 mg/dL (ref >39)
LDL Chol Calc (NIH): 119 mg/dL — ABNORMAL HIGH (ref 0–99)
Triglycerides: 53 mg/dL (ref 0–149)
VLDL Cholesterol Cal: 8 mg/dL (ref 5–40)

## 2024-11-09 LAB — CBC
Hematocrit: 42.5 % (ref 34.0–46.6)
Hemoglobin: 14.6 g/dL (ref 11.1–15.9)
MCH: 31.9 pg (ref 26.6–33.0)
MCHC: 34.4 g/dL (ref 31.5–35.7)
MCV: 93 fL (ref 79–97)
Platelets: 265 x10E3/uL (ref 150–450)
RBC: 4.58 x10E6/uL (ref 3.77–5.28)
RDW: 11.8 % (ref 11.7–15.4)
WBC: 3.6 x10E3/uL (ref 3.4–10.8)

## 2024-11-09 LAB — CMP14+EGFR
ALT: 33 IU/L — ABNORMAL HIGH (ref 0–32)
AST: 37 IU/L (ref 0–40)
Albumin: 4.8 g/dL (ref 3.8–4.9)
Alkaline Phosphatase: 76 IU/L (ref 49–135)
BUN/Creatinine Ratio: 16 (ref 9–23)
BUN: 15 mg/dL (ref 6–24)
Bilirubin Total: 0.3 mg/dL (ref 0.0–1.2)
CO2: 24 mmol/L (ref 20–29)
Calcium: 10.5 mg/dL — ABNORMAL HIGH (ref 8.7–10.2)
Chloride: 102 mmol/L (ref 96–106)
Creatinine, Ser: 0.96 mg/dL (ref 0.57–1.00)
Globulin, Total: 2.5 g/dL (ref 1.5–4.5)
Glucose: 93 mg/dL (ref 70–99)
Potassium: 4.4 mmol/L (ref 3.5–5.2)
Sodium: 141 mmol/L (ref 134–144)
Total Protein: 7.3 g/dL (ref 6.0–8.5)
eGFR: 69 mL/min/1.73 (ref >59)

## 2024-11-09 LAB — VITAMIN B12: Vitamin B-12: >2000 pg/mL — ABNORMAL HIGH (ref 232–1245)

## 2024-11-09 LAB — HEMOGLOBIN A1C
Est. average glucose Bld gHb Est-mCnc: 108 mg/dL
Hgb A1c MFr Bld: 5.4 % (ref 4.8–5.6)

## 2024-11-09 LAB — TSH+FREE T4
Free T4: 1.47 ng/dL (ref 0.82–1.77)
TSH: 1.06 u[IU]/mL (ref 0.450–4.500)

## 2024-11-09 LAB — VITAMIN D 25 HYDROXY (VIT D DEFICIENCY, FRACTURES): Vit D, 25-Hydroxy: 87.2 ng/mL (ref 30.0–100.0)

## 2024-12-09 LAB — COMPREHENSIVE METABOLIC PANEL WITH GFR
ALT: 44 IU/L — ABNORMAL HIGH (ref 0–32)
AST: 39 IU/L (ref 0–40)
Albumin: 4.3 g/dL (ref 3.8–4.9)
Alkaline Phosphatase: 82 IU/L (ref 49–135)
BUN/Creatinine Ratio: 17 (ref 9–23)
BUN: 19 mg/dL (ref 6–24)
Bilirubin Total: 0.2 mg/dL (ref 0.0–1.2)
CO2: 23 mmol/L (ref 20–29)
Calcium: 9.8 mg/dL (ref 8.7–10.2)
Chloride: 105 mmol/L (ref 96–106)
Creatinine, Ser: 1.1 mg/dL — ABNORMAL HIGH (ref 0.57–1.00)
Globulin, Total: 2.5 g/dL (ref 1.5–4.5)
Glucose: 79 mg/dL (ref 70–99)
Potassium: 4.9 mmol/L (ref 3.5–5.2)
Sodium: 141 mmol/L (ref 134–144)
Total Protein: 6.8 g/dL (ref 6.0–8.5)
eGFR: 59 mL/min/1.73 — ABNORMAL LOW

## 2024-12-13 ENCOUNTER — Telehealth: Payer: Self-pay

## 2024-12-13 NOTE — Telephone Encounter (Signed)
 Returning patient call as she requested to speak to me. Believe she was returning missed call from this morning in regards to lab results.   E2C2 - If call is returned please clarify what call is in regards too. If call is for lab results please advise per provider on labs completed 12/08/24 and resulted on 12/10/24.

## 2024-12-13 NOTE — Telephone Encounter (Signed)
 Copied from CRM [id number]. Topic: Clinical - Lab/Test Results >> Dec 13, 2024 10:54 AM Ivette P wrote: Reason for CRM: Pt called in regarding lab results. Read as followsBETHA Sharma Coyer, MD to [facility] Nurse     12/10/24 10:10 AM Result Note Liver enzyme (ALT) is elevated at 44, mildly increased from 33. Given the fluctuations in your liver enzymes over the last few years, I would like to order a fibrosure test to evaluate for structural changes to the liver that may explain the elevated levels.    Calcium level has normalized   Kidney enzyme level is mildly elevated compared to earlier in December, would recommend oral hydration to help normalize this level    Pt wanted to know where she gets test and oral hydration. Called [facility] and was told that [facility] will reach out to pt due to system issues and not being able to get pt back on the line.

## 2024-12-15 LAB — FIB-4 W/RX NASH FIBROSURE PLUS
ALT: 29 IU/L (ref 0–32)
AST: 31 IU/L (ref 0–40)
FIB-4 Index: 1.27 (ref 0.00–2.67)
Platelets: 253 x10E3/uL (ref 150–450)

## 2024-12-15 NOTE — Telephone Encounter (Signed)
 Looks like this is resolved? patient had updated test and oral hydration is recommending that she drink more water

## 2024-12-22 ENCOUNTER — Ambulatory Visit
Admission: RE | Admit: 2024-12-22 | Discharge: 2024-12-22 | Disposition: A | Discharge status: Home / Self Care | Source: Ambulatory Visit | Attending: Family Medicine | Admitting: Family Medicine

## 2024-12-22 DIAGNOSIS — Z1231 Encounter for screening mammogram for malignant neoplasm of breast: Secondary | ICD-10-CM | POA: Diagnosis present

## 2025-11-10 ENCOUNTER — Encounter: Admitting: Family Medicine
